# Patient Record
Sex: Female | Born: 1965 | Race: Black or African American | Hispanic: No | Smoking: Never smoker
Health system: Southern US, Community
[De-identification: ages and names within clinical notes are randomized; demographics above are authoritative.]

## PROBLEM LIST (undated history)

## (undated) DIAGNOSIS — I10 Essential (primary) hypertension: Secondary | ICD-10-CM

## (undated) DIAGNOSIS — F419 Anxiety disorder, unspecified: Secondary | ICD-10-CM

## (undated) HISTORY — PX: BREAST SURGERY: SHX581

## (undated) HISTORY — DX: Anxiety disorder, unspecified: F41.9

## (undated) HISTORY — DX: Essential (primary) hypertension: I10

## (undated) HISTORY — PX: CHOLECYSTECTOMY: SHX55

---

## 2004-05-17 ENCOUNTER — Other Ambulatory Visit: Payer: Self-pay

## 2004-11-30 ENCOUNTER — Ambulatory Visit: Payer: Self-pay | Admitting: Internal Medicine

## 2006-12-05 ENCOUNTER — Ambulatory Visit: Payer: Self-pay | Admitting: Internal Medicine

## 2009-10-16 ENCOUNTER — Ambulatory Visit: Payer: Self-pay | Admitting: Internal Medicine

## 2014-09-26 HISTORY — PX: BREAST EXCISIONAL BIOPSY: SUR124

## 2016-08-29 ENCOUNTER — Other Ambulatory Visit: Payer: Self-pay | Admitting: Internal Medicine

## 2016-08-31 ENCOUNTER — Other Ambulatory Visit: Payer: Self-pay | Admitting: *Deleted

## 2016-08-31 ENCOUNTER — Inpatient Hospital Stay
Admission: RE | Admit: 2016-08-31 | Discharge: 2016-08-31 | Disposition: A | Discharge status: Home / Self Care | Payer: Self-pay | Source: Ambulatory Visit | Attending: *Deleted | Admitting: *Deleted

## 2016-08-31 DIAGNOSIS — Z9289 Personal history of other medical treatment: Secondary | ICD-10-CM

## 2016-09-01 ENCOUNTER — Other Ambulatory Visit: Payer: Self-pay | Admitting: Internal Medicine

## 2016-09-01 DIAGNOSIS — Z1231 Encounter for screening mammogram for malignant neoplasm of breast: Secondary | ICD-10-CM

## 2016-10-07 ENCOUNTER — Ambulatory Visit: Payer: Self-pay | Attending: Internal Medicine

## 2016-11-18 DIAGNOSIS — Z79899 Other long term (current) drug therapy: Secondary | ICD-10-CM | POA: Diagnosis not present

## 2016-11-18 DIAGNOSIS — Z1329 Encounter for screening for other suspected endocrine disorder: Secondary | ICD-10-CM | POA: Diagnosis not present

## 2016-11-18 DIAGNOSIS — I10 Essential (primary) hypertension: Secondary | ICD-10-CM | POA: Diagnosis not present

## 2016-11-18 DIAGNOSIS — N390 Urinary tract infection, site not specified: Secondary | ICD-10-CM | POA: Diagnosis not present

## 2016-11-18 DIAGNOSIS — R7309 Other abnormal glucose: Secondary | ICD-10-CM | POA: Diagnosis not present

## 2016-11-18 DIAGNOSIS — Z0000 Encounter for general adult medical examination without abnormal findings: Secondary | ICD-10-CM | POA: Diagnosis not present

## 2016-11-18 DIAGNOSIS — Z13220 Encounter for screening for lipoid disorders: Secondary | ICD-10-CM | POA: Diagnosis not present

## 2016-12-13 ENCOUNTER — Telehealth: Payer: Self-pay | Admitting: Obstetrics & Gynecology

## 2016-12-13 NOTE — Telephone Encounter (Signed)
I called and spoke with Breanna Hull earlier this morning about moving up her referral from her appt in  April to Monday 26th with Dr. Kenton Kingfisher. Breanna Hull stated she would call back to see if she could make the more sooner appt. I called back to follow up about Monday's schedule appt her voicemail box was full unable to leave an message

## 2016-12-13 NOTE — Telephone Encounter (Signed)
Called pt to follow up about her schedule appt. VM box is full unable to leave message

## 2016-12-14 NOTE — Telephone Encounter (Signed)
Attempting to confirm appt schedule for 11/29/16 unable to reach pt due to voicemail box being full

## 2016-12-19 ENCOUNTER — Ambulatory Visit: Payer: Self-pay | Admitting: Obstetrics & Gynecology

## 2017-01-20 ENCOUNTER — Ambulatory Visit (INDEPENDENT_AMBULATORY_CARE_PROVIDER_SITE_OTHER): Payer: BLUE CROSS/BLUE SHIELD | Admitting: Obstetrics and Gynecology

## 2017-01-20 ENCOUNTER — Encounter: Payer: Self-pay | Admitting: Obstetrics and Gynecology

## 2017-01-20 VITALS — BP 152/96 | HR 59 | Ht 62.0 in | Wt 200.0 lb

## 2017-01-20 DIAGNOSIS — N852 Hypertrophy of uterus: Secondary | ICD-10-CM

## 2017-01-20 DIAGNOSIS — Z124 Encounter for screening for malignant neoplasm of cervix: Secondary | ICD-10-CM | POA: Diagnosis not present

## 2017-01-20 DIAGNOSIS — R875 Abnormal microbiological findings in specimens from female genital organs: Secondary | ICD-10-CM | POA: Diagnosis not present

## 2017-01-20 DIAGNOSIS — Z1239 Encounter for other screening for malignant neoplasm of breast: Secondary | ICD-10-CM

## 2017-01-20 DIAGNOSIS — Z1231 Encounter for screening mammogram for malignant neoplasm of breast: Secondary | ICD-10-CM | POA: Diagnosis not present

## 2017-01-20 DIAGNOSIS — Z01419 Encounter for gynecological examination (general) (routine) without abnormal findings: Secondary | ICD-10-CM

## 2017-01-20 NOTE — Patient Instructions (Signed)

## 2017-01-20 NOTE — Progress Notes (Signed)
Patient ID: Breanna Hull, female   DOB: 09-28-1965, 51 y.o.   MRN: [id number]     Gynecology Annual Exam  PCP: No PCP Per Patient  Chief Complaint:  Chief Complaint  Patient presents with  . Gynecologic Exam  . Leg Pain    History of Present Illness:Patient is a 51 y.o. [id number] presents for annual exam. The patient has no complaints today.   LMP: No LMP recorded. Patient is postmenopausal.  Denies postmenopausal bleeding.   The patient is sexually active. She denies dyspareunia.  The patient does not perform self breast exams.  There is no notable family history of breast or ovarian cancer in her family.  The patient wears seatbelts: yes.   The patient has regular exercise: not asked.    The patient denies current symptoms of depression.     Review of Systems: Review of Systems  Constitutional: Negative for chills and fever.  HENT: Negative for congestion.   Respiratory: Negative for cough and shortness of breath.   Cardiovascular: Negative for chest pain and palpitations.  Gastrointestinal: Negative for abdominal pain, constipation, diarrhea, heartburn, nausea and vomiting.  Genitourinary: Negative for dysuria, frequency and urgency.  Skin: Negative for itching and rash.  Neurological: Negative for dizziness and headaches.  Endo/Heme/Allergies: Negative for polydipsia.  Psychiatric/Behavioral: Negative for depression.    Past Medical History:  Past Medical History:  Diagnosis Date  . Anxiety   . Hypertension     Past Surgical History:  Past Surgical History:  Procedure Laterality Date  . BREAST BIOPSY     Right  . CHOLECYSTECTOMY      Gynecologic History:  No LMP recorded. Patient is postmenopausal. Last Pap: Results were: no abnormalities  Last mammogram: 1 year ago reportedly normal  Family History:  Family History  Problem Relation Age of Onset  . Breast cancer Mother   . Ovarian cancer Sister 35  . Breast cancer Maternal Aunt 49    Social History:   Social History   Social History  . Marital status: Divorced    Spouse name: N/A  . Number of children: N/A  . Years of education: N/A   Occupational History  . Not on file.   Social History Main Topics  . Smoking status: Never Smoker  . Smokeless tobacco: Never Used  . Alcohol use No  . Drug use: No  . Sexual activity: Not Currently   Other Topics Concern  . Not on file   Social History Narrative  . No narrative on file    Allergies:  No Known Allergies  Medications: Prior to Admission medications   Medication Sig Start Date End Date Taking? Authorizing Provider  sertraline (ZOLOFT) 50 MG tablet take 1 tablet by mouth once daily 01/18/17  Yes Historical Provider, MD  losartan-hydrochlorothiazide [facility]) 100-12.5 MG tablet  01/18/17   Historical Provider, MD    Physical Exam Vitals: Blood pressure (!) 152/96, pulse (!) 59, height 5\' 2"  (1.575 m), weight 200 lb (90.7 kg).  General: NAD HEENT: normocephalic, anicteric Thyroid: no enlargement, no palpable nodules Pulmonary: No increased work of breathing, CTAB Cardiovascular: RRR, distal pulses 2+ Breast: Breast symmetrical, no tenderness, no palpable nodules or masses, no skin or nipple retraction present, no nipple discharge.  No axillary or supraclavicular lymphadenopathy. Abdomen: NABS, soft, non-tender, non-distended.  Umbilicus without lesions.  No hepatomegaly, splenomegaly or masses palpable. No evidence of hernia  Genitourinary:  External: Normal external female genitalia.  Normal urethral meatus, normal  Bartholin's and Skene's glands.  Vagina: Normal vaginal mucosa, no evidence of prolapse.    Cervix: Grossly normal in appearance, no bleeding  Uterus: Enlarged 12-15 weeks size, mobile, irregular2contour.  No CMT  Adnexa: ovaries non-enlarged, no adnexal masses  Rectal: deferred  Lymphatic: no evidence of inguinal lymphadenopathy Extremities: no edema, erythema, or tenderness Neurologic: Grossly  intact Psychiatric: mood appropriate, affect full  Female chaperone present for pelvic and breast  portions of the physical exam    Assessment: 51 y.o. [id number] No problem-specific Assessment & Plan notes found for this encounter.   Plan: Problem List Items Addressed This Visit    None    Visit Diagnoses    Bulky or enlarged uterus    -  Primary   Relevant Orders   US Transvaginal Non-OB   Screening for malignant neoplasm of cervix       Relevant Orders   PapIG, HPV, rfx 16/18   Breast screening       Relevant Orders   MM DIGITAL SCREENING BILATERAL   Encounter for gynecological examination without abnormal finding       Relevant Orders   PapIG, HPV, rfx 16/18      1) Mammogram - recommend yearly screening mammogram.  Mammogram Was ordered today  2) STI screening was offered and declined  3) ASCCP guidelines and rational discussed.  Patient opts for every 3 years screening interval  4) Osteoporosis  - per USPTF routine screening DEXA at age 30  5) Routine healthcare maintenance including cholesterol, diabetes screening discussed managed by PCP  6) Colonoscopy - recommended declined given handout on cologuard testing  7) TVUS in the next 1-2 weeks to assess uterine enlargement noted on physical exam suspect fibroids  8)  Follow up 1 year for routine annual

## 2017-01-25 LAB — PAPIG, HPV, RFX 16/18
HPV, HIGH-RISK: NEGATIVE
PAP Smear Comment: 0

## 2017-01-26 NOTE — Progress Notes (Signed)
Follow up on trichomonas NAA added on and tell patient results

## 2017-01-31 ENCOUNTER — Other Ambulatory Visit: Payer: Self-pay | Admitting: *Deleted

## 2017-01-31 ENCOUNTER — Inpatient Hospital Stay
Admission: RE | Admit: 2017-01-31 | Discharge: 2017-01-31 | Disposition: A | Discharge status: Home / Self Care | Payer: Self-pay | Source: Ambulatory Visit | Attending: *Deleted | Admitting: *Deleted

## 2017-01-31 DIAGNOSIS — Z1231 Encounter for screening mammogram for malignant neoplasm of breast: Secondary | ICD-10-CM

## 2017-02-02 ENCOUNTER — Ambulatory Visit
Admission: RE | Admit: 2017-02-02 | Discharge: 2017-02-02 | Disposition: A | Discharge status: Home / Self Care | Payer: BLUE CROSS/BLUE SHIELD | Source: Ambulatory Visit | Attending: Obstetrics and Gynecology | Admitting: Obstetrics and Gynecology

## 2017-02-02 DIAGNOSIS — Z1231 Encounter for screening mammogram for malignant neoplasm of breast: Secondary | ICD-10-CM | POA: Insufficient documentation

## 2017-02-02 DIAGNOSIS — Z1239 Encounter for other screening for malignant neoplasm of breast: Secondary | ICD-10-CM

## 2017-02-07 ENCOUNTER — Other Ambulatory Visit: Payer: Self-pay | Admitting: *Deleted

## 2017-02-07 ENCOUNTER — Inpatient Hospital Stay
Admission: RE | Admit: 2017-02-07 | Discharge: 2017-02-07 | Disposition: A | Discharge status: Home / Self Care | Payer: Self-pay | Source: Ambulatory Visit | Attending: *Deleted | Admitting: *Deleted

## 2017-02-07 DIAGNOSIS — Z9289 Personal history of other medical treatment: Secondary | ICD-10-CM

## 2017-02-08 ENCOUNTER — Ambulatory Visit (INDEPENDENT_AMBULATORY_CARE_PROVIDER_SITE_OTHER): Payer: BLUE CROSS/BLUE SHIELD

## 2017-02-08 ENCOUNTER — Encounter: Payer: Self-pay | Admitting: Obstetrics and Gynecology

## 2017-02-08 ENCOUNTER — Ambulatory Visit (INDEPENDENT_AMBULATORY_CARE_PROVIDER_SITE_OTHER): Payer: BLUE CROSS/BLUE SHIELD | Admitting: Obstetrics and Gynecology

## 2017-02-08 VITALS — BP 128/78 | Wt 198.0 lb

## 2017-02-08 DIAGNOSIS — D259 Leiomyoma of uterus, unspecified: Secondary | ICD-10-CM | POA: Diagnosis not present

## 2017-02-08 DIAGNOSIS — A5901 Trichomonal vulvovaginitis: Secondary | ICD-10-CM | POA: Diagnosis not present

## 2017-02-08 DIAGNOSIS — Z113 Encounter for screening for infections with a predominantly sexual mode of transmission: Secondary | ICD-10-CM

## 2017-02-08 DIAGNOSIS — N852 Hypertrophy of uterus: Secondary | ICD-10-CM

## 2017-02-08 NOTE — Patient Instructions (Signed)

## 2017-02-08 NOTE — Progress Notes (Signed)
Gynecology Ultrasound Follow Up  Chief Complaint:  Chief Complaint  Patient presents with  . U/S follow up  . Gynecologic Exam     History of Present Illness: Patient is a 51 y.o. female who presents today for ultrasound evaluation of enlarged uterus noted on annual exam .  Ultrasound demonstrates the following findgins Adnexa: no masses seen, ovaries not clearly visualzied Uterus: Single 2x2cm intramural fibroid with endometrial stripe slightly irregular but less than 60mm Additional: no free fluid  She also presents for trichomonas testing given positive on pap.  However, the patient is deemed low risk, has not been sexually active for several years, is asymptomatic with no vaginal discharge.    Review of Systems: Review of Systems  Gastrointestinal: Negative for abdominal pain.  Remainder of HPI negative unless otherwise noted in HPI  Past Medical History:  Past Medical History:  Diagnosis Date  . Anxiety   . Hypertension     Past Surgical History:  Past Surgical History:  Procedure Laterality Date  . BREAST EXCISIONAL BIOPSY Right 2016   NEG  . CHOLECYSTECTOMY      Gynecologic History:  No LMP recorded. Patient is postmenopausal.  Family History:  Family History  Problem Relation Age of Onset  . Ovarian cancer Sister 55  . Breast cancer Maternal Aunt 49    Social History:  Social History   Social History  . Marital status: Divorced    Spouse name: N/A  . Number of children: N/A  . Years of education: N/A   Occupational History  . Not on file.   Social History Main Topics  . Smoking status: Never Smoker  . Smokeless tobacco: Never Used  . Alcohol use No  . Drug use: No  . Sexual activity: Not Currently   Other Topics Concern  . Not on file   Social History Narrative  . No narrative on file    Allergies:  No Known Allergies  Medications: Prior to Admission medications   Medication Sig Start Date End Date Taking? Authorizing Provider   losartan-hydrochlorothiazide [facility]) 100-12.5 MG tablet  01/18/17   [provider]  sertraline (ZOLOFT) 50 MG tablet take 1 tablet by mouth once daily 01/18/17   [provider]    Physical Exam Vitals: Blood pressure 128/78, weight 198 lb (89.8 kg).  General: NAD HEENT: normocephalic, anicteric Pulmonary: No increased work of breathing Extremities: no edema, erythema, or tenderness Genitourianry: normal external female genital, normal appearing cervix, physiologic discharge Neurologic: Grossly intact, normal gait Psychiatric: mood appropriate, affect full  Wet mount Negative for clue cells, hyphea, or trichomonas   Assessment: 51 y.o. [id number] follow up trichomonas on pap, uterine enlargement noted at time of annual with 2x2cm intramural fibroid on ultrasound today  Plan: Problem List Items Addressed This Visit    None    Visit Diagnoses    Vaginitis due to Trichomonas    -  Primary   Relevant Orders   NuSwab Vaginitis Plus (VG+)   Uterine leiomyoma, unspecified location       Routine screening for STI (sexually transmitted infection)       Relevant Orders   NuSwab Vaginitis Plus (VG+)      1) Ultrasound - single 2cm intramural fibroid.  No bleeding concerns, asymptomatic, discussed natural course of fibroids, generally decrease in size postmenopausal years.   2) Trichomonas on pap - has not been sexually active in several years, retested today, no motile trichomonas on wet mount. 3) A total of  15 minutes were spent in face-to-face contact with the patient during this encounter with over half of that time devoted to counseling and coordination of care.

## 2017-02-11 LAB — NUSWAB VAGINITIS PLUS (VG+)
CANDIDA ALBICANS, NAA: NEGATIVE
CANDIDA GLABRATA, NAA: NEGATIVE
CHLAMYDIA TRACHOMATIS, NAA: NEGATIVE
NEISSERIA GONORRHOEAE, NAA: NEGATIVE
Trich vag by NAA: POSITIVE — AB

## 2017-02-14 ENCOUNTER — Other Ambulatory Visit: Payer: Self-pay | Admitting: Obstetrics and Gynecology

## 2017-02-14 MED ORDER — METRONIDAZOLE 500 MG PO TABS: 2000.0000 mg | ORAL_TABLET | Freq: Once | ORAL | 0 refills | Status: AC

## 2019-05-29 ENCOUNTER — Ambulatory Visit: Payer: Self-pay

## 2019-05-29 ENCOUNTER — Other Ambulatory Visit: Payer: Self-pay

## 2019-06-04 ENCOUNTER — Ambulatory Visit: Payer: Self-pay | Admitting: Licensed Clinical Social Worker

## 2019-06-04 ENCOUNTER — Encounter: Payer: Self-pay | Admitting: Gerontology

## 2019-06-04 ENCOUNTER — Other Ambulatory Visit: Payer: Self-pay

## 2019-06-04 ENCOUNTER — Ambulatory Visit: Payer: Self-pay | Admitting: Gerontology

## 2019-06-04 VITALS — BP 185/95 | HR 60 | Temp 98.4°F | Wt 179.0 lb

## 2019-06-04 DIAGNOSIS — F332 Major depressive disorder, recurrent severe without psychotic features: Secondary | ICD-10-CM

## 2019-06-04 DIAGNOSIS — I10 Essential (primary) hypertension: Secondary | ICD-10-CM

## 2019-06-04 DIAGNOSIS — F419 Anxiety disorder, unspecified: Secondary | ICD-10-CM

## 2019-06-04 DIAGNOSIS — Z7689 Persons encountering health services in other specified circumstances: Secondary | ICD-10-CM

## 2019-06-04 DIAGNOSIS — F410 Panic disorder [episodic paroxysmal anxiety] without agoraphobia: Secondary | ICD-10-CM

## 2019-06-04 MED ORDER — AMLODIPINE BESYLATE 5 MG PO TABS: 5.0000 mg | ORAL_TABLET | Freq: Every day | ORAL | 0 refills | Status: DC

## 2019-06-04 MED ORDER — BLOOD PRESSURE KIT: 1.0000 | PACK | Freq: Every day | 0 refills | Status: AC

## 2019-06-04 NOTE — Progress Notes (Signed)
Patient ID: Breanna Hull, female   DOB: 04-07-1966, 53 y.o.   MRN: [id number]  Chief Complaint  Patient presents with  . Establish Care    HPI Breanna Hull is a 53 y.o. female who presents to establish care and evaluation of her chronic problems. She states that she has hypertension and stopped taking10 mg Amlodipine in 2018. She denies headache, chest pain and palpitation . She also reports that she has a history of anxiety and was taking 50 mg of Zoloft until she ran out of refills. She experiences intermittent dizziness with panic attacks, reports that her mood fluctuates, she is emotional and was teary during visit. She denies suicidal or homicidal ideation. She states that she's being divorced for 6 years and in a new relationship, but she continues to pay her ex husbands mortgage which causes her a great deal of stress. She states that her last mammogram was done in 2016 after having an excisional biopsy to her right breast in 2016 which was negative. She also hasn't had colonoscopy done. She denies fever, chills, and no further concerns.   Past Medical History:  Diagnosis Date  . Anxiety   . Hypertension     Past Surgical History:  Procedure Laterality Date  . BREAST EXCISIONAL BIOPSY Right 2016   NEG  . CHOLECYSTECTOMY      Family History  Problem Relation Age of Onset  . Ovarian cancer Sister 63  . Breast cancer Maternal Aunt 41    Social History Social History   Tobacco Use  . Smoking status: Never Smoker  . Smokeless tobacco: Never Used  Substance Use Topics  . Alcohol use: No  . Drug use: No    No Known Allergies  Current Outpatient Medications  Medication Sig Dispense Refill  . amLODipine (NORVASC) 5 MG tablet Take 1 tablet (5 mg total) by mouth daily. 30 tablet 0  . Blood Pressure KIT 1 kit by Does not apply route daily. 1 kit 0  . sertraline (ZOLOFT) 50 MG tablet take 1 tablet by mouth once daily     No current facility-administered medications for this  visit.     Review of Systems Review of Systems  Constitutional: Negative.   HENT: Negative.   Respiratory: Negative.   Cardiovascular: Negative.   Gastrointestinal: Negative.   Endocrine: Negative.   Genitourinary: Negative.   Musculoskeletal: Negative.   Skin: Negative.   Neurological: Positive for dizziness (intemittent with panic attack).  Psychiatric/Behavioral: The patient is nervous/anxious (teary).     Blood pressure (!) 185/95, pulse 60, temperature 98.4 F (36.9 C), weight 179 lb (81.2 kg), SpO2 98 %.  Physical Exam Physical Exam Constitutional:      Appearance: Normal appearance.  HENT:     Head: Normocephalic and atraumatic.     Nose: Nose normal.     Mouth/Throat:     Mouth: Mucous membranes are moist.  Eyes:     Extraocular Movements: Extraocular movements intact.     Pupils: Pupils are equal, round, and reactive to light.  Cardiovascular:     Rate and Rhythm: Normal rate and regular rhythm.     Pulses: Normal pulses.     Heart sounds: Normal heart sounds.  Pulmonary:     Effort: Pulmonary effort is normal.     Breath sounds: Normal breath sounds.  Abdominal:     General: Abdomen is flat.     Palpations: Abdomen is soft.  Skin:    General: Skin is warm and dry.  Neurological:  General: No focal deficit present.     Mental Status: She is alert and oriented to person, place, and time.  Psychiatric:        Mood and Affect: Mood normal.        Behavior: Behavior normal.        Thought Content: Thought content normal.        Judgment: Judgment normal.     Comments: Teary during visit     Data Reviewed Past medical history was reviewed and routine labs will be checked.  Assessment and Plan  1. Encounter to establish care - Routine labs will be checked. - CBC w/Diff; Future - Lipid panel; Future - Comp Met (CMET); Future - Urinalysis; Future - HgB A1c; Future - TSH; Future Ambulatory referral to Hematology / Oncology for Mammogram through  [facility]. -- She was encouraged to complete charity care application for referral to  Ambulatory referral to Gastroenterology  2. Essential hypertension - Uncontrolled hypertension due to loss to follow up, and her goal blood pressure is < 140/90. She was advised to start Amlodipine, and was educated on medication side effects and to notify clinic. She was advised to continue on low sodium diet and exercise as tolerated. She was advised to check her blood pressure twice daily, document and bring log to clinic. She was advised to notify clinic or go to the ED with chest pain and worsening symptoms. - amLODipine (NORVASC) 5 MG tablet; Take 1 tablet (5 mg total) by mouth daily.  Dispense: 30 tablet; Refill: 0 - Blood Pressure KIT; 1 kit by Does not apply route daily.  Dispense: 1 kit; Refill: 0  3. Anxiety - She will follow up with Ms. Simpson for mental health care and was advised to go to the ED with worsening anxiety or suicidal or homicidal ideation.   Follow up on 06/18/2019 ( 2 weeks) if symptom worsens or fails to imptove.  Afnan Cadiente E Madix Blowe 06/04/2019, 11:26 AM

## 2019-06-04 NOTE — Patient Instructions (Signed)
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
## 2019-06-05 ENCOUNTER — Other Ambulatory Visit: Payer: Self-pay

## 2019-06-05 DIAGNOSIS — Z7689 Persons encountering health services in other specified circumstances: Secondary | ICD-10-CM

## 2019-06-06 ENCOUNTER — Telehealth: Payer: Self-pay

## 2019-06-06 ENCOUNTER — Other Ambulatory Visit: Payer: Self-pay

## 2019-06-06 DIAGNOSIS — Z1211 Encounter for screening for malignant neoplasm of colon: Secondary | ICD-10-CM

## 2019-06-06 LAB — URINALYSIS
Bilirubin, UA: NEGATIVE
Glucose, UA: NEGATIVE
Ketones, UA: NEGATIVE
Leukocytes,UA: NEGATIVE
Nitrite, UA: NEGATIVE
Protein,UA: NEGATIVE
RBC, UA: NEGATIVE
Specific Gravity, UA: 1.02 (ref 1.005–1.030)
Urobilinogen, Ur: 0.2 mg/dL (ref 0.2–1.0)
pH, UA: 5.5 (ref 5.0–7.5)

## 2019-06-06 LAB — CBC WITH DIFFERENTIAL/PLATELET
Basophils Absolute: 0 10*3/uL (ref 0.0–0.2)
Basos: 0 %
EOS (ABSOLUTE): 0.1 10*3/uL (ref 0.0–0.4)
Eos: 2 %
Hematocrit: 35.7 % (ref 34.0–46.6)
Hemoglobin: 12.2 g/dL (ref 11.1–15.9)
Immature Grans (Abs): 0 10*3/uL (ref 0.0–0.1)
Immature Granulocytes: 0 %
Lymphocytes Absolute: 1.4 10*3/uL (ref 0.7–3.1)
Lymphs: 23 %
MCH: 29.9 pg (ref 26.6–33.0)
MCHC: 34.2 g/dL (ref 31.5–35.7)
MCV: 88 fL (ref 79–97)
Monocytes Absolute: 0.3 10*3/uL (ref 0.1–0.9)
Monocytes: 6 %
Neutrophils Absolute: 4.1 10*3/uL (ref 1.4–7.0)
Neutrophils: 69 %
Platelets: 267 10*3/uL (ref 150–450)
RBC: 4.08 x10E6/uL (ref 3.77–5.28)
RDW: 12.5 % (ref 11.7–15.4)
WBC: 6 10*3/uL (ref 3.4–10.8)

## 2019-06-06 LAB — COMPREHENSIVE METABOLIC PANEL
ALT: 7 IU/L (ref 0–32)
AST: 16 IU/L (ref 0–40)
Albumin/Globulin Ratio: 1.1 — ABNORMAL LOW (ref 1.2–2.2)
Albumin: 3.6 g/dL — ABNORMAL LOW (ref 3.8–4.9)
Alkaline Phosphatase: 79 IU/L (ref 39–117)
BUN/Creatinine Ratio: 22 (ref 9–23)
BUN: 24 mg/dL (ref 6–24)
Bilirubin Total: 0.5 mg/dL (ref 0.0–1.2)
CO2: 27 mmol/L (ref 20–29)
Calcium: 9.4 mg/dL (ref 8.7–10.2)
Chloride: 104 mmol/L (ref 96–106)
Creatinine, Ser: 1.09 mg/dL — ABNORMAL HIGH (ref 0.57–1.00)
GFR calc Af Amer: 67 mL/min/{1.73_m2} (ref >59)
GFR calc non Af Amer: 58 mL/min/{1.73_m2} — ABNORMAL LOW (ref >59)
Globulin, Total: 3.2 g/dL (ref 1.5–4.5)
Glucose: 85 mg/dL (ref 65–99)
Potassium: 4 mmol/L (ref 3.5–5.2)
Sodium: 145 mmol/L — ABNORMAL HIGH (ref 134–144)
Total Protein: 6.8 g/dL (ref 6.0–8.5)

## 2019-06-06 LAB — LIPID PANEL
Chol/HDL Ratio: 2.4 ratio (ref 0.0–4.4)
Cholesterol, Total: 232 mg/dL — ABNORMAL HIGH (ref 100–199)
HDL: 97 mg/dL (ref >39)
LDL Chol Calc (NIH): 124 mg/dL — ABNORMAL HIGH (ref 0–99)
Triglycerides: 64 mg/dL (ref 0–149)
VLDL Cholesterol Cal: 11 mg/dL (ref 5–40)

## 2019-06-06 LAB — HEMOGLOBIN A1C
Est. average glucose Bld gHb Est-mCnc: 103 mg/dL
Hgb A1c MFr Bld: 5.2 % (ref 4.8–5.6)

## 2019-06-06 LAB — TSH: TSH: 1.87 u[IU]/mL (ref 0.450–4.500)

## 2019-06-06 MED ORDER — GOLYTELY 236 G PO SOLR: 4000.0000 mL | Freq: Once | ORAL | 0 refills | Status: AC

## 2019-06-06 NOTE — Telephone Encounter (Signed)
Gastroenterology Pre-Procedure Review  Request Date: 06/11/19 Requesting Physician: Dr. Vicente Males  PATIENT REVIEW QUESTIONS: The patient responded to the following health history questions as indicated:    1. Are you having any GI issues? no 2. Do you have a personal history of Polyps? no 3. Do you have a family history of Colon Cancer or Polyps? no 4. Diabetes Mellitus? no 5. Joint replacements in the past 12 months?no 6. Major health problems in the past 3 months?no 7. Any artificial heart valves, MVP, or defibrillator?no    MEDICATIONS & ALLERGIES:    Patient reports the following regarding taking any anticoagulation/antiplatelet therapy:   Plavix, Coumadin, Eliquis, Xarelto, Lovenox, Pradaxa, Brilinta, or Effient? no Aspirin? no  Patient confirms/reports the following medications:  Current Outpatient Medications  Medication Sig Dispense Refill  . amLODipine (NORVASC) 5 MG tablet Take 1 tablet (5 mg total) by mouth daily. 30 tablet 0  . Blood Pressure KIT 1 kit by Does not apply route daily. 1 kit 0  . polyethylene glycol (GOLYTELY) 236 g solution Take 4,000 mLs by mouth once for 1 dose. 4000 mL 0  . sertraline (ZOLOFT) 50 MG tablet take 1 tablet by mouth once daily     No current facility-administered medications for this visit.     Patient confirms/reports the following allergies:  No Known Allergies  No orders of the defined types were placed in this encounter.   AUTHORIZATION INFORMATION Primary Insurance: 1D#: Group #:  Secondary Insurance: 1D#: Group #:  SCHEDULE INFORMATION: Date: 06/11/19 Time: Location:[facility]

## 2019-06-07 ENCOUNTER — Other Ambulatory Visit: Payer: Self-pay

## 2019-06-07 ENCOUNTER — Other Ambulatory Visit
Admission: RE | Admit: 2019-06-07 | Discharge: 2019-06-07 | Disposition: A | Discharge status: Home / Self Care | Payer: HRSA Program | Source: Ambulatory Visit | Attending: Gastroenterology | Admitting: Gastroenterology

## 2019-06-07 DIAGNOSIS — Z01812 Encounter for preprocedural laboratory examination: Secondary | ICD-10-CM | POA: Insufficient documentation

## 2019-06-07 DIAGNOSIS — Z20828 Contact with and (suspected) exposure to other viral communicable diseases: Secondary | ICD-10-CM | POA: Diagnosis not present

## 2019-06-08 LAB — SARS CORONAVIRUS 2 (TAT 6-24 HRS): SARS Coronavirus 2: NEGATIVE

## 2019-06-11 ENCOUNTER — Other Ambulatory Visit: Payer: Self-pay

## 2019-06-11 ENCOUNTER — Encounter: Payer: Self-pay | Admitting: *Deleted

## 2019-06-11 ENCOUNTER — Encounter
Admission: RE | Disposition: A | Discharge status: Home / Self Care | Payer: Self-pay | Source: Home / Self Care | Attending: Gastroenterology

## 2019-06-11 ENCOUNTER — Ambulatory Visit: Payer: Self-pay | Admitting: Anesthesiology

## 2019-06-11 ENCOUNTER — Ambulatory Visit
Admission: RE | Admit: 2019-06-11 | Discharge: 2019-06-11 | Disposition: A | Discharge status: Home / Self Care | Payer: Self-pay | Attending: Gastroenterology | Admitting: Gastroenterology

## 2019-06-11 ENCOUNTER — Ambulatory Visit: Payer: Self-pay | Admitting: Licensed Clinical Social Worker

## 2019-06-11 DIAGNOSIS — Z1211 Encounter for screening for malignant neoplasm of colon: Secondary | ICD-10-CM | POA: Insufficient documentation

## 2019-06-11 DIAGNOSIS — Z79899 Other long term (current) drug therapy: Secondary | ICD-10-CM | POA: Insufficient documentation

## 2019-06-11 DIAGNOSIS — I10 Essential (primary) hypertension: Secondary | ICD-10-CM | POA: Insufficient documentation

## 2019-06-11 DIAGNOSIS — F410 Panic disorder [episodic paroxysmal anxiety] without agoraphobia: Secondary | ICD-10-CM

## 2019-06-11 DIAGNOSIS — F419 Anxiety disorder, unspecified: Secondary | ICD-10-CM | POA: Insufficient documentation

## 2019-06-11 DIAGNOSIS — F332 Major depressive disorder, recurrent severe without psychotic features: Secondary | ICD-10-CM

## 2019-06-11 HISTORY — PX: COLONOSCOPY WITH PROPOFOL: SHX5780

## 2019-06-11 SURGERY — COLONOSCOPY WITH PROPOFOL
Anesthesia: General

## 2019-06-11 MED ORDER — SODIUM CHLORIDE 0.9 % IV SOLN
INTRAVENOUS | Status: DC
  Administered 2019-06-11: 1000 mL via INTRAVENOUS

## 2019-06-11 MED ORDER — PROPOFOL 500 MG/50ML IV EMUL
INTRAVENOUS | Status: DC | PRN
  Administered 2019-06-11: 150 ug/kg/min via INTRAVENOUS

## 2019-06-11 MED ORDER — LIDOCAINE HCL (PF) 2 % IJ SOLN
INTRAMUSCULAR | Status: AC
  Filled 2019-06-11: qty 10

## 2019-06-11 MED ORDER — SERTRALINE HCL 50 MG PO TABS: 50.0000 mg | ORAL_TABLET | Freq: Every day | ORAL | 0 refills | Status: DC

## 2019-06-11 MED ORDER — LIDOCAINE HCL (CARDIAC) PF 100 MG/5ML IV SOSY
PREFILLED_SYRINGE | INTRAVENOUS | Status: DC | PRN
  Administered 2019-06-11 (×2): 40 mg via INTRAVENOUS

## 2019-06-11 MED ORDER — PROPOFOL 500 MG/50ML IV EMUL
INTRAVENOUS | Status: AC
  Filled 2019-06-11: qty 50

## 2019-06-11 MED ORDER — PROPOFOL 10 MG/ML IV BOLUS
INTRAVENOUS | Status: DC | PRN
  Administered 2019-06-11: 100 mg via INTRAVENOUS

## 2019-06-11 NOTE — Transfer of Care (Signed)
Immediate Anesthesia Transfer of Care Note  Patient: Breanna Hull  Procedure(s) Performed: Procedure(s): COLONOSCOPY WITH PROPOFOL (N/A)  Patient Location: PACU and Endoscopy Unit  Anesthesia Type:General  Level of Consciousness: sedated  Airway & Oxygen Therapy: Patient Spontanous Breathing and Patient connected to nasal cannula oxygen  Post-op Assessment: Report given to RN and Post -op Vital signs reviewed and stable  Post vital signs: Reviewed and stable  Last Vitals:  Vitals:   06/11/19 0745 06/11/19 0914  BP: (!) 181/89 (!) 149/96  Pulse: (!) 52   Resp: 16 17  Temp: (!) 36.1 C   SpO2: [id number]    Complications: No apparent anesthesia complications

## 2019-06-11 NOTE — BH Specialist Note (Signed)
Integrated Behavioral Health Follow Up Visit Via Phone  MRN: [id number] Name: Breanna Hull  Number of [place] Clinician visits: 1/6   Type of Service: [place] Interpretor:No. Interpretor Name and Language: not applicable.   SUBJECTIVE: Breanna Hull is a 53 y.o. female accompanied by herself. Patient was referred by Carlyon Shadow NP for mental health. Patient reports the following symptoms/concerns: She reports that she was overdue for colonoscopy and decided to go ahead and have the procedure. She notes that she has a mammogram scheduled for next week on Wednesday. She notes that prior to coming to [facility] that she has not seen a doctor since 2018. She reports that she had anxiety attack driving to pick up the stuff she needed to drink prior to the colonoscopy this morning. She notes that her daughter sent her some job applications but has yet to look at them and has income from unemployment until December of 2020. She notes that she is struggling with anger episodes because she will say one thing but mean another and regret saying it after. She denies suicidal and homicidal thoughts. Duration of problem: ; Severity of problem: moderate  OBJECTIVE: Mood: Euthymic and Affect: Appropriate Risk of harm to self or others: No plan to harm self or others  LIFE CONTEXT: Family and Social: Ms. Sarka reports that she sees her friend at least once a day or sometimes more. She reports that towards the end of her marriage that she would binge on sweats to get the emotional response that she was not getting in her marriage. She notes that the last time she loved her body was before she had kids.  School/Work: see above. Self-Care: Ms. Mauss reports that she walks a lot at the park near her house and walks for a little while in the mornings. She notes that she will do a ten minute exercise watching you tube or walk away the pounds when she is  unable to get outside. She notes that she will go on a walk when she gets the urge to binge eat like she did in the past. She notes that she tries to stay active and focus on her health. She notes that she used to garden when she had a house.  Life Changes: see above.   GOALS ADDRESSED: Patient will: 1.  Reduce symptoms of: agitation  2.  Increase knowledge and/or ability of: coping skills, healthy habits and stress reduction  3.  Demonstrate ability to: Increase healthy adjustment to current life circumstances  INTERVENTIONS: Interventions utilized:  Brief CBT was utilized by the clinician focusing on the patient's agitation and affect on behavior. Clinician processed with the patient regarding how she has been doing since the last follow up session. Clinician explained to the patient that she is glad to hear that her colonoscopy went well and that she is feeling better. Clinician provided psycho education to the patient regarding that her anger stems from her anxiety. Clinician suggested that the patient find a healthy outlet to channel her anger so she is not taking it on other people. Clinician asked the patient if she has had any hobbies in the past or anything she used to do for enjoyment. Clinician explained to the patient that she has to work on some self management techniques for when she becomes agitated, expressing that she is upset and that she needs a minute to calm down before she takes it out on someone else. Standardized Assessments completed: next session  ASSESSMENT:  Patient currently experiencing see above.   Patient may benefit from see above.  PLAN: 1. Follow up with behavioral health clinician on : one week or earlier if needed. 2. Behavioral recommendations: Case consultation with Dr. Octavia Heir, MD, psychiatric consultant recommended that Ms. Hollick start Zoloft 25 for three to four days and if tolerated with no side effects, increase to 50 mg PO daily for anxiety, depression,  and panic attacks. No further recommendations at this point in time. Patient was agreeable to the plans.  3. Referral(s): [place] (In Clinic) 4. "From scale of 1-10, how likely are you to follow plan?":   Bayard Hugger, LCSW

## 2019-06-11 NOTE — Anesthesia Procedure Notes (Signed)
Date/Time: 06/11/2019 8:48 AM Performed by: Doreen Salvage, CRNA Pre-anesthesia Checklist: Patient identified, Emergency Drugs available, Suction available and Patient being monitored Patient Re-evaluated:Patient Re-evaluated prior to induction Oxygen Delivery Method: Nasal cannula Induction Type: IV induction Dental Injury: Teeth and Oropharynx as per pre-operative assessment  Comments: Nasal cannula with etCO2 monitoring
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
## 2019-06-11 NOTE — Anesthesia Preprocedure Evaluation (Signed)
Anesthesia Evaluation  Patient identified by MRN, date of birth, ID band Patient awake    Reviewed: Allergy & Precautions, H&P , NPO status , Patient's Chart, lab work & pertinent test results, reviewed documented beta blocker date and time   Airway Mallampati: II   Neck ROM: full    Dental  (+) Poor Dentition   Pulmonary neg pulmonary ROS,    Pulmonary exam normal        Cardiovascular Exercise Tolerance: Good hypertension, On Medications negative cardio ROS Normal cardiovascular exam Rhythm:regular Rate:Normal     Neuro/Psych Anxiety negative neurological ROS  negative psych ROS   GI/Hepatic negative GI ROS, Neg liver ROS,   Endo/Other  negative endocrine ROS  Renal/GU negative Renal ROS  negative genitourinary   Musculoskeletal   Abdominal   Peds  Hematology negative hematology ROS (+)   Anesthesia Other Findings Past Medical History: No date: Anxiety No date: Hypertension Past Surgical History: 2016: BREAST EXCISIONAL BIOPSY; Right     Comment:  NEG No date: BREAST SURGERY No date: CHOLECYSTECTOMY BMI    Body Mass Index: 31.83 kg/m     Reproductive/Obstetrics negative OB ROS                             Anesthesia Physical Anesthesia Plan  ASA: II  Anesthesia Plan: General   Post-op Pain Management:    Induction:   PONV Risk Score and Plan:   Airway Management Planned:   Additional Equipment:   Intra-op Plan:   Post-operative Plan:   Informed Consent: I have reviewed the patients History and Physical, chart, labs and discussed the procedure including the risks, benefits and alternatives for the proposed anesthesia with the patient or authorized representative who has indicated his/her understanding and acceptance.     Dental Advisory Given  Plan Discussed with: CRNA  Anesthesia Plan Comments:         Anesthesia Quick Evaluation

## 2019-06-11 NOTE — Anesthesia Post-op Follow-up Note (Signed)
Anesthesia QCDR form completed.        

## 2019-06-11 NOTE — Op Note (Signed)
[facility] Gastroenterology Patient Name: Breanna Hull Procedure Date: 06/11/2019 8:45 AM MRN: [id number] Account #: [id number] Date of Birth: September 15, 1966 Admit Type: Outpatient Age: 53 Room: [facility] ENDO ROOM 1 Gender: Female Note Status: Finalized Procedure:            Colonoscopy Indications:          Screening for colorectal malignant neoplasm Providers:            Jonathon Bellows MD, MD Referring MD:         No Local Md, MD (Referring MD) Medicines:            Monitored Anesthesia Care Complications:        No immediate complications. Procedure:            Pre-Anesthesia Assessment:                       - Prior to the procedure, a History and Physical was                        performed, and patient medications, allergies and                        sensitivities were reviewed. The patient's tolerance of                        previous anesthesia was reviewed.                       - The risks and benefits of the procedure and the                        sedation options and risks were discussed with the                        patient. All questions were answered and informed                        consent was obtained.                       - ASA Grade Assessment: II - A patient with mild                        systemic disease.                       After obtaining informed consent, the colonoscope was                        passed under direct vision. Throughout the procedure,                        the patient's blood pressure, pulse, and oxygen                        saturations were monitored continuously. The                        Colonoscope was introduced through the anus and  advanced to the the cecum, identified by the                        appendiceal orifice. The colonoscopy was performed with                        ease. The patient tolerated the procedure well. The                        quality of the bowel preparation was  excellent. Findings:      The perianal and digital rectal examinations were normal.      The entire examined colon appeared normal on direct and retroflexion       views. Impression:           - The entire examined colon is normal on direct and                        retroflexion views.                       - No specimens collected. Recommendation:       - Discharge patient to home (with escort).                       - Advance diet as tolerated.                       - Continue present medications.                       - Repeat colonoscopy in 10 years for screening purposes. Procedure Code(s):    --- Professional ---                       [phone], Colonoscopy, flexible; diagnostic, including                        collection of specimen(s) by brushing or washing, when                        performed (separate procedure) Diagnosis Code(s):    --- Professional ---                       Z12.11, Encounter for screening for malignant neoplasm                        of colon CPT copyright 2019 American Medical Association. All rights reserved. The codes documented in this report are preliminary and upon coder review may  be revised to meet current compliance requirements. Jonathon Bellows, MD Jonathon Bellows MD, MD 06/11/2019 9:10:18 AM This report has been signed electronically. Number of Addenda: 0 Note Initiated On: 06/11/2019 8:45 AM Scope Withdrawal Time: 0 hours 7 minutes 13 seconds  Total Procedure Duration: 0 hours 9 minutes 55 seconds  Estimated Blood Loss: Estimated blood loss: none.      [facility]

## 2019-06-12 ENCOUNTER — Encounter: Payer: Self-pay | Admitting: Gastroenterology

## 2019-06-18 ENCOUNTER — Encounter: Payer: Self-pay | Admitting: Gerontology

## 2019-06-18 ENCOUNTER — Ambulatory Visit: Payer: Self-pay | Admitting: Gerontology

## 2019-06-18 ENCOUNTER — Other Ambulatory Visit: Payer: Self-pay

## 2019-06-18 ENCOUNTER — Ambulatory Visit: Payer: Self-pay | Admitting: Licensed Clinical Social Worker

## 2019-06-18 VITALS — BP 162/89 | HR 60 | Ht 62.0 in | Wt 178.0 lb

## 2019-06-18 DIAGNOSIS — R899 Unspecified abnormal finding in specimens from other organs, systems and tissues: Secondary | ICD-10-CM | POA: Insufficient documentation

## 2019-06-18 DIAGNOSIS — F410 Panic disorder [episodic paroxysmal anxiety] without agoraphobia: Secondary | ICD-10-CM

## 2019-06-18 DIAGNOSIS — I10 Essential (primary) hypertension: Secondary | ICD-10-CM

## 2019-06-18 DIAGNOSIS — F332 Major depressive disorder, recurrent severe without psychotic features: Secondary | ICD-10-CM

## 2019-06-18 DIAGNOSIS — E785 Hyperlipidemia, unspecified: Secondary | ICD-10-CM | POA: Insufficient documentation

## 2019-06-18 MED ORDER — AMLODIPINE BESYLATE 5 MG PO TABS: 10.0000 mg | ORAL_TABLET | Freq: Every day | ORAL | 0 refills | Status: DC
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
## 2019-06-18 NOTE — BH Specialist Note (Signed)
Integrated Behavioral Health Follow Up Visit Via Phone  MRN: [id number] Name: Nanyamka Raga  Number of [facility] Clinician visits: 2/6  Type of Service: [place] Interpretor:No. Interpretor Name and Language: not applicable.   SUBJECTIVE: Azarria Yturralde is a 53 y.o. female accompanied by herself. Patient was referred by Carlyon Shadow NP for mental health.  Patient reports the following symptoms/concerns: She notes that she did pretty good with managing her anger until Sunday evening and felt like she wanted to lash out at her friend. She explains that she was grocery shopping with her friend due to an employee stopping them in the middle of shopping to price check an item and they had to wait around for awhile for her to return with a response. She notes that she got angry with him because that had to wait around for awhile for a response and it took to long. She explains that she feels like her friend is testing her and doing these things on purpose. She notes that she has been walking a little bit more as one of her healthy outlets. She reports that she has noticed an improvement in her sleep at night. She denies suicidal and homicidal thoughts.  Duration of problem:  Severity of problem: moderate  OBJECTIVE: Mood: Euthymic and Affect: Appropriate Risk of harm to self or others: No plan to harm self or others  LIFE CONTEXT: Family and Social: see above. School/Work: see above. Self-Care: see above. Life Changes: see above.   GOALS ADDRESSED: Patient will: 1.  Reduce symptoms of: agitation  2.  Increase knowledge and/or ability of: coping skills and self-management skills  3.  Demonstrate ability to: Increase healthy adjustment to current life circumstances  INTERVENTIONS: Interventions utilized:  Brief CBT was utilized by the clinician focusing on the patient's anger and triggers. Clinician processed with the patient  regarding how she has been doing since the last follow up session. Clinician discussed with the patient regarding her most recent episode of anger. Clinician asked the patient to reflect on the situation and context of the argument to discern if it was worth getting upset over. Clinician explained to the patient that she could have approached the situation differently by asking her friend, would you mind if I continue shopping while you wait on the store employee for a response. Clinician asked the patient if there was any urgency for them to finish shopping. Clinician encouraged the patient to try to reflect and think about things before she responds.  Standardized Assessments completed: GAD-7 and PHQ 9  ASSESSMENT: Patient currently experiencing see above.   Patient may benefit from see above.  PLAN: 1. Follow up with behavioral health clinician on :  2. Behavioral recommendations:  3. Referral(s): [place] (In Clinic) 4. "From scale of 1-10, how likely are you to follow plan?":   Bayard Hugger, LCSW

## 2019-06-18 NOTE — Patient Instructions (Signed)
Fat and Cholesterol Restricted Eating Plan Getting too much fat and cholesterol in your diet may cause health problems. Choosing the right foods helps keep your fat and cholesterol at normal levels. This can keep you from getting certain diseases. Your doctor may recommend an eating plan that includes:  Total fat: ______% or less of total calories a day.  Saturated fat: ______% or less of total calories a day.  Cholesterol: less than _________mg a day.  Fiber: ______g a day. What are tips for following this plan? Meal planning  At meals, divide your plate into four equal parts: ? Fill one-half of your plate with vegetables and green salads. ? Fill one-fourth of your plate with whole grains. ? Fill one-fourth of your plate with low-fat (lean) protein foods.  Eat fish that is high in omega-3 fats at least two times a week. This includes mackerel, tuna, sardines, and salmon.  Eat foods that are high in fiber, such as whole grains, beans, apples, broccoli, carrots, peas, and barley. General tips   Work with your doctor to lose weight if you need to.  Avoid: ? Foods with added sugar. ? Fried foods. ? Foods with partially hydrogenated oils.  Limit alcohol intake to no more than 1 drink a day for nonpregnant women and 2 drinks a day for men. One drink equals 12 oz of beer, 5 oz of wine, or 1 oz of hard liquor. Reading food labels  Check food labels for: ? Trans fats. ? Partially hydrogenated oils. ? Saturated fat (g) in each serving. ? Cholesterol (mg) in each serving. ? Fiber (g) in each serving.  Choose foods with healthy fats, such as: ? Monounsaturated fats. ? Polyunsaturated fats. ? Omega-3 fats.  Choose grain products that have whole grains. Look for the word "whole" as the first word in the ingredient list. Cooking  Cook foods using low-fat methods. These include baking, boiling, grilling, and broiling.  Eat more home-cooked foods. Eat at restaurants and buffets  less often.  Avoid cooking using saturated fats, such as butter, cream, palm oil, palm kernel oil, and coconut oil. Recommended foods  Fruits  All fresh, canned (in natural juice), or frozen fruits. Vegetables  Fresh or frozen vegetables (raw, steamed, roasted, or grilled). Green salads. Grains  Whole grains, such as whole wheat or whole grain breads, crackers, cereals, and pasta. Unsweetened oatmeal, bulgur, barley, quinoa, or brown rice. Corn or whole wheat flour tortillas. Meats and other protein foods  Ground beef (85% or leaner), grass-fed beef, or beef trimmed of fat. Skinless chicken or [place]. Ground chicken or [place]. Pork trimmed of fat. All fish and seafood. Egg whites. Dried beans, peas, or lentils. Unsalted nuts or seeds. Unsalted canned beans. Nut butters without added sugar or oil. Dairy  Low-fat or nonfat dairy products, such as skim or 1% milk, 2% or reduced-fat cheeses, low-fat and fat-free ricotta or cottage cheese, or plain low-fat and nonfat yogurt. Fats and oils  Tub margarine without trans fats. Light or reduced-fat mayonnaise and salad dressings. Avocado. Olive, canola, sesame, or safflower oils. The items listed above may not be a complete list of foods and beverages you can eat. Contact a dietitian for more information. Foods to avoid Fruits  Canned fruit in heavy syrup. Fruit in cream or butter sauce. Fried fruit. Vegetables  Vegetables cooked in cheese, cream, or butter sauce. Fried vegetables. Grains  White bread. White pasta. White rice. Cornbread. Bagels, pastries, and croissants. Crackers and snack foods that contain trans fat  and hydrogenated oils. °Meats and other protein foods °· Fatty cuts of meat. Ribs, chicken wings, bacon, sausage, bologna, salami, chitterlings, fatback, hot dogs, bratwurst, and packaged lunch meats. Liver and organ meats. Whole eggs and egg yolks. Chicken and turkey with skin. Fried meat. °Dairy °· Whole or 2% milk, cream,  half-and-half, and cream cheese. Whole milk cheeses. Whole-fat or sweetened yogurt. Full-fat cheeses. Nondairy creamers and whipped toppings. Processed cheese, cheese spreads, and cheese curds. °Beverages °· Alcohol. Sugar-sweetened drinks such as sodas, lemonade, and fruit drinks. °Fats and oils °· Butter, stick margarine, lard, shortening, ghee, or bacon fat. Coconut, palm kernel, and palm oils. °Sweets and desserts °· Corn syrup, sugars, honey, and molasses. Candy. Jam and jelly. Syrup. Sweetened cereals. Cookies, pies, cakes, donuts, muffins, and ice cream. °The items listed above may not be a complete list of foods and beverages you should avoid. Contact a dietitian for more information. °Summary °· Choosing the right foods helps keep your fat and cholesterol at normal levels. This can keep you from getting certain diseases. °· At meals, fill one-half of your plate with vegetables and green salads. °· Eat high-fiber foods, like whole grains, beans, apples, carrots, peas, and barley. °· Limit added sugar, saturated fats, alcohol, and fried foods. °This information is not intended to replace advice given to you by your health care provider. Make sure you discuss any questions you have with your health care provider. °Document Released: 03/13/2012 Document Revised: 05/16/2018 Document Reviewed: 05/30/2017 °Elsevier Patient Education © 2020 Elsevier Inc[employer] °DASH Eating Plan °DASH stands for "Dietary Approaches to Stop Hypertension." The DASH eating plan is a healthy eating plan that has been shown to reduce high blood pressure (hypertension). It may also reduce your risk for type 2 diabetes, heart disease, and stroke. The DASH eating plan may also help with weight loss. °What are tips for following this plan? ° °General guidelines °· Avoid eating more than 2,300 mg (milligrams) of salt (sodium) a day. If you have hypertension, you may need to reduce your sodium intake to 1,500 mg a day. °· Limit alcohol intake to no  more than 1 drink a day for nonpregnant women and 2 drinks a day for men. One drink equals 12 oz of beer, 5 oz of wine, or 1½ oz of hard liquor. °· Work with your health care provider to maintain a healthy body weight or to lose weight. Ask what an ideal weight is for you. °· Get at least 30 minutes of exercise that causes your heart to beat faster (aerobic exercise) most days of the week. Activities may include walking, swimming, or biking. °· Work with your health care provider or diet and nutrition specialist (dietitian) to adjust your eating plan to your individual calorie needs. °Reading food labels ° °· Check food labels for the amount of sodium per serving. Choose foods with less than 5 percent of the Daily Value of sodium. Generally, foods with less than 300 mg of sodium per serving fit into this eating plan. °· To find whole grains, look for the word "whole" as the first word in the ingredient list. °Shopping °· Buy products labeled as "low-sodium" or "no salt added." °· Buy fresh foods. Avoid canned foods and premade or frozen meals. °Cooking °· Avoid adding salt when cooking. Use salt-free seasonings or herbs instead of table salt or sea salt. Check with your health care provider or pharmacist before using salt substitutes. °· Do not fry foods. Cook foods using healthy methods such as   baking, boiling, grilling, and broiling instead. °· Cook with heart-healthy oils, such as olive, canola, soybean, or sunflower oil. °Meal planning °· Eat a balanced diet that includes: °? 5 or more servings of fruits and vegetables each day. At each meal, try to fill half of your plate with fruits and vegetables. °? Up to 6-8 servings of whole grains each day. °? Less than 6 oz of lean meat, poultry, or fish each day. A 3-oz serving of meat is about the same size as a deck of cards. One egg equals 1 oz. °? 2 servings of low-fat dairy each day. °? A serving of nuts, seeds, or beans 5 times each week. °? Heart-healthy fats.  Healthy fats called Omega-3 fatty acids are found in foods such as flaxseeds and coldwater fish, like sardines, salmon, and mackerel. °· Limit how much you eat of the following: °? Canned or prepackaged foods. °? Food that is high in trans fat, such as fried foods. °? Food that is high in saturated fat, such as fatty meat. °? Sweets, desserts, sugary drinks, and other foods with added sugar. °? Full-fat dairy products. °· Do not salt foods before eating. °· Try to eat at least 2 vegetarian meals each week. °· Eat more home-cooked food and less restaurant, buffet, and fast food. °· When eating at a restaurant, ask that your food be prepared with less salt or no salt, if possible. °What foods are recommended? °The items listed may not be a complete list. Talk with your dietitian about what dietary choices are best for you. °Grains °Whole-grain or whole-wheat bread. Whole-grain or whole-wheat pasta. Brown rice. Oatmeal. Quinoa. Bulgur. Whole-grain and low-sodium cereals. Pita bread. Low-fat, low-sodium crackers. Whole-wheat flour tortillas. °Vegetables °Fresh or frozen vegetables (raw, steamed, roasted, or grilled). Low-sodium or reduced-sodium tomato and vegetable juice. Low-sodium or reduced-sodium tomato sauce and tomato paste. Low-sodium or reduced-sodium canned vegetables. °Fruits °All fresh, dried, or frozen fruit. Canned fruit in natural juice (without added sugar). °Meat and other protein foods °Skinless chicken or turkey. Ground chicken or turkey. Pork with fat trimmed off. Fish and seafood. Egg whites. Dried beans, peas, or lentils. Unsalted nuts, nut butters, and seeds. Unsalted canned beans. Lean cuts of beef with fat trimmed off. Low-sodium, lean deli meat. °Dairy °Low-fat (1%) or fat-free (skim) milk. Fat-free, low-fat, or reduced-fat cheeses. Nonfat, low-sodium ricotta or cottage cheese. Low-fat or nonfat yogurt. Low-fat, low-sodium cheese. °Fats and oils °Soft margarine without trans fats. Vegetable  oil. Low-fat, reduced-fat, or light mayonnaise and salad dressings (reduced-sodium). Canola, safflower, olive, soybean, and sunflower oils. Avocado. °Seasoning and other foods °Herbs. Spices. Seasoning mixes without salt. Unsalted popcorn and pretzels. Fat-free sweets. °What foods are not recommended? °The items listed may not be a complete list. Talk with your dietitian about what dietary choices are best for you. °Grains °Baked goods made with fat, such as croissants, muffins, or some breads. Dry pasta or rice meal packs. °Vegetables °Creamed or fried vegetables. Vegetables in a cheese sauce. Regular canned vegetables (not low-sodium or reduced-sodium). Regular canned tomato sauce and paste (not low-sodium or reduced-sodium). Regular tomato and vegetable juice (not low-sodium or reduced-sodium). Pickles. Olives. °Fruits °Canned fruit in a light or heavy syrup. Fried fruit. Fruit in cream or butter sauce. °Meat and other protein foods °Fatty cuts of meat. Ribs. Fried meat. Bacon. Sausage. Bologna and other processed lunch meats. Salami. Fatback. Hotdogs. Bratwurst. Salted nuts and seeds. Canned beans with added salt. Canned or smoked fish. Whole eggs or egg yolks.   Chicken or turkey with skin. °Dairy °Whole or 2% milk, cream, and half-and-half. Whole or full-fat cream cheese. Whole-fat or sweetened yogurt. Full-fat cheese. Nondairy creamers. Whipped toppings. Processed cheese and cheese spreads. °Fats and oils °Butter. Stick margarine. Lard. Shortening. Ghee. Bacon fat. Tropical oils, such as coconut, palm kernel, or palm oil. °Seasoning and other foods °Salted popcorn and pretzels. Onion salt, garlic salt, seasoned salt, table salt, and sea salt. Worcestershire sauce. Tartar sauce. Barbecue sauce. Teriyaki sauce. Soy sauce, including reduced-sodium. Steak sauce. Canned and packaged gravies. Fish sauce. Oyster sauce. Cocktail sauce. Horseradish that you find on the shelf. Ketchup. Mustard. Meat flavorings and  tenderizers. Bouillon cubes. Hot sauce and Tabasco sauce. Premade or packaged marinades. Premade or packaged taco seasonings. Relishes. Regular salad dressings. °Where to find more information: °· National Heart, Lung, and Blood Institute: www.nhlbi.nih.gov °· American Heart Association: www.heart.org °Summary °· The DASH eating plan is a healthy eating plan that has been shown to reduce high blood pressure (hypertension). It may also reduce your risk for type 2 diabetes, heart disease, and stroke. °· With the DASH eating plan, you should limit salt (sodium) intake to 2,300 mg a day. If you have hypertension, you may need to reduce your sodium intake to 1,500 mg a day. °· When on the DASH eating plan, aim to eat more fresh fruits and vegetables, whole grains, lean proteins, low-fat dairy, and heart-healthy fats. °· Work with your health care provider or diet and nutrition specialist (dietitian) to adjust your eating plan to your individual calorie needs. °This information is not intended to replace advice given to you by your health care provider. Make sure you discuss any questions you have with your health care provider. °Document Released: 09/01/2011 Document Revised: 08/25/2017 Document Reviewed: 09/05/2016 °Elsevier Patient Education © 2020 Elsevier Inc. ° °

## 2019-06-18 NOTE — Anesthesia Postprocedure Evaluation (Signed)
Anesthesia Post Note  Patient: Juanell Knowlton  Procedure(s) Performed: COLONOSCOPY WITH PROPOFOL (N/A )  Patient location during evaluation: PACU Anesthesia Type: General Level of consciousness: awake and alert Pain management: pain level controlled Vital Signs Assessment: post-procedure vital signs reviewed and stable Respiratory status: spontaneous breathing, nonlabored ventilation, respiratory function stable and patient connected to nasal cannula oxygen Cardiovascular status: blood pressure returned to baseline and stable Postop Assessment: no apparent nausea or vomiting Anesthetic complications: no     Last Vitals:  Vitals:   06/11/19 0914 06/11/19 0924  BP: (!) 149/96 (!) 176/94  Pulse:  (!) 54  Resp: 17 12  Temp: (!) 36.1 C   SpO2: 99% 100%    Last Pain:  Vitals:   06/12/19 0736  TempSrc:   PainSc: 0-No pain                 Molli Barrows

## 2019-06-19 ENCOUNTER — Ambulatory Visit
Admission: RE | Admit: 2019-06-19 | Discharge: 2019-06-19 | Disposition: A | Discharge status: Home / Self Care | Payer: Self-pay | Source: Ambulatory Visit | Attending: Oncology | Admitting: Oncology

## 2019-06-19 ENCOUNTER — Ambulatory Visit: Payer: Self-pay | Attending: Oncology | Admitting: *Deleted

## 2019-06-19 ENCOUNTER — Other Ambulatory Visit: Payer: Self-pay

## 2019-06-19 VITALS — BP 162/73 | HR 56 | Temp 96.8°F | Ht 62.0 in | Wt 179.9 lb

## 2019-06-19 DIAGNOSIS — Z0000 Encounter for general adult medical examination without abnormal findings: Secondary | ICD-10-CM

## 2019-06-19 NOTE — Progress Notes (Signed)
  Subjective:     Patient ID: Breanna Hull, female   DOB: 09-05-1966, 53 y.o.   MRN: [id number]  HPI   Review of Systems     Objective:   Physical Exam Chest:     Breasts:        Right: No swelling, bleeding, inverted nipple, mass, nipple discharge, skin change or tenderness.        Left: No swelling, bleeding, inverted nipple, mass, nipple discharge, skin change or tenderness.       Comments: Large pendulous breast Lymphadenopathy:     Upper Body:     Right upper body: No supraclavicular or axillary adenopathy.     Left upper body: No supraclavicular or axillary adenopathy.        Assessment:     53 year old [place] American female referred to [facility] by [facility] for clinical breast exam and mammogram only.  Clinical breast exam reveals bilateral breast to have a diffuse fibroglandular like pattern.  Taught self breast awareness.  Patient does have a family history of breast cancer in her maternal aunt in her 20's, and her sister with cervical cancer in her 35s.  Patient does not know if her aunt had genetic testing.  Tyrer-Cuzick risk assessment with a 14.5% lifetime risk of breast cancer and a NCCN guideline recommendation for genetic testing.  Discussed with patient option to return to see one of our medical oncologist in our high risk breast clinic for possible genetic counseling and testing.  States she would like to think about it and she will call me back.  Patient has been screened for eligibility.  She does not have any insurance, Medicare or Medicaid.  She also meets financial eligibility.  Hand-out given on the Affordable Care Act.    Plan:     Screening mammogram ordered.  Will follow up per [facility] protocol.  Will anticipate a return phone call from patient with her decision to proceed with genetic counseling.

## 2019-06-20 ENCOUNTER — Encounter: Payer: Self-pay | Admitting: *Deleted

## 2019-06-20 NOTE — Patient Instructions (Signed)
Gave patient hand-out, Women Staying Healthy, Active and Well from [facility], with education on breast health, pap smears, heart and colon health. 

## 2019-06-25 ENCOUNTER — Ambulatory Visit: Payer: Self-pay | Admitting: Licensed Clinical Social Worker

## 2019-06-25 ENCOUNTER — Other Ambulatory Visit: Payer: Self-pay

## 2019-06-25 DIAGNOSIS — F332 Major depressive disorder, recurrent severe without psychotic features: Secondary | ICD-10-CM

## 2019-06-25 DIAGNOSIS — F410 Panic disorder [episodic paroxysmal anxiety] without agoraphobia: Secondary | ICD-10-CM

## 2019-06-25 NOTE — BH Specialist Note (Signed)
Integrated Behavioral Health Follow Up Visit Via Phone  MRN: [id number] Name: Breanna Hull  Number of [facility] Clinician visits: 3/6  Type of Service: [place] Interpretor:No. Interpretor Name and Language: Not applicable.   SUBJECTIVE: Breanna Hull is a 53 y.o. female accompanied by herself. Patient was referred by Carlyon Shadow NP for mental health. Patient reports the following symptoms/concerns: She notes that she has been doing a lot better since the last follow up session. She explains that she is working on accepting things she cannot control versus what she can control. She notes that she tries to take a minute before she becomes reactive and respond in a certain situation. She reports that her mood is better and realizes she cannot completely rely on the medication to fix everything. She notes that she had her mammogram on September 23 rd and had an episode of anxiety that day. She explains that the result was normal. She notes that she went to the mall when it was raining to do her walking and has been trying to walk a little bit more than she was. She notes that last week she visited her grand kids twice and took them lunch twice. She notes that she got together with a friend, watched TV, and talked. She notes that she is trying to figure out what she wants to do in terms of work and would like to find a job that she enjoys. She denies suicidal and homicidal thoughts.  Duration of problem: ; Severity of problem: moderate  OBJECTIVE: Mood: Euthymic and Affect: Appropriate Risk of harm to self or others: No plan to harm self or others  LIFE CONTEXT: Family and Social: see above.  School/Work: see above.  Self-Care: see above.  Life Changes: see above.   GOALS ADDRESSED: Patient will: 1.  Reduce symptoms of: agitation  2.  Increase knowledge and/or ability of: coping skills and self-management skills  3.  Demonstrate  ability to: Increase healthy adjustment to current life circumstances  INTERVENTIONS: Interventions utilized:  Brief CBT was utilized by the clinician focusing on the patient's improvement with her ability to manage her anger. Clinician processed with the patient regarding how she has been doing since the last follow up session. Clinician explained to the patient that it sounds like she has been working on stopping and thinking before she speaks as opposed to becoming reactive to situations. Clinician encouraged the patient to continue with her walks daily and try to work on coming up with more things for enjoyment.  Standardized Assessments completed: next session  ASSESSMENT: Patient currently experiencing see above.   Patient may benefit from see above.  PLAN: 1. Follow up with behavioral health clinician on : one week or earlier if needed. 2. Behavioral recommendations: see above.  3. Referral(s): [place] (In Clinic) 4. "From scale of 1-10, how likely are you to follow plan?":   Bayard Hugger, LCSW

## 2019-07-04 ENCOUNTER — Ambulatory Visit: Payer: Self-pay | Admitting: Licensed Clinical Social Worker

## 2019-07-04 ENCOUNTER — Other Ambulatory Visit: Payer: Self-pay

## 2019-07-04 DIAGNOSIS — F410 Panic disorder [episodic paroxysmal anxiety] without agoraphobia: Secondary | ICD-10-CM

## 2019-07-04 DIAGNOSIS — F332 Major depressive disorder, recurrent severe without psychotic features: Secondary | ICD-10-CM
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
## 2019-07-09 ENCOUNTER — Other Ambulatory Visit: Payer: Self-pay

## 2019-07-09 ENCOUNTER — Encounter: Payer: Self-pay | Admitting: Gerontology

## 2019-07-09 ENCOUNTER — Ambulatory Visit: Payer: Self-pay | Admitting: Gerontology

## 2019-07-09 VITALS — BP 139/79 | HR 71 | Ht 62.0 in | Wt 178.2 lb

## 2019-07-09 DIAGNOSIS — I10 Essential (primary) hypertension: Secondary | ICD-10-CM

## 2019-07-09 DIAGNOSIS — E785 Hyperlipidemia, unspecified: Secondary | ICD-10-CM

## 2019-07-09 MED ORDER — SERTRALINE HCL 50 MG PO TABS: 75.0000 mg | ORAL_TABLET | Freq: Every day | ORAL | 0 refills | Status: DC

## 2019-07-09 MED ORDER — AMLODIPINE BESYLATE 5 MG PO TABS: 10.0000 mg | ORAL_TABLET | Freq: Every day | ORAL | 3 refills | Status: AC
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
## 2019-07-09 NOTE — Patient Instructions (Signed)
Fat and Cholesterol Restricted Eating Plan Getting too much fat and cholesterol in your diet may cause health problems. Choosing the right foods helps keep your fat and cholesterol at normal levels. This can keep you from getting certain diseases. Your doctor may recommend an eating plan that includes:  Total fat: ______% or less of total calories a day.  Saturated fat: ______% or less of total calories a day.  Cholesterol: less than _________mg a day.  Fiber: ______g a day. What are tips for following this plan? Meal planning  At meals, divide your plate into four equal parts: ? Fill one-half of your plate with vegetables and green salads. ? Fill one-fourth of your plate with whole grains. ? Fill one-fourth of your plate with low-fat (lean) protein foods.  Eat fish that is high in omega-3 fats at least two times a week. This includes mackerel, tuna, sardines, and salmon.  Eat foods that are high in fiber, such as whole grains, beans, apples, broccoli, carrots, peas, and barley. General tips   Work with your doctor to lose weight if you need to.  Avoid: ? Foods with added sugar. ? Fried foods. ? Foods with partially hydrogenated oils.  Limit alcohol intake to no more than 1 drink a day for nonpregnant women and 2 drinks a day for men. One drink equals 12 oz of beer, 5 oz of wine, or 1 oz of hard liquor. Reading food labels  Check food labels for: ? Trans fats. ? Partially hydrogenated oils. ? Saturated fat (g) in each serving. ? Cholesterol (mg) in each serving. ? Fiber (g) in each serving.  Choose foods with healthy fats, such as: ? Monounsaturated fats. ? Polyunsaturated fats. ? Omega-3 fats.  Choose grain products that have whole grains. Look for the word "whole" as the first word in the ingredient list. Cooking  Cook foods using low-fat methods. These include baking, boiling, grilling, and broiling.  Eat more home-cooked foods. Eat at restaurants and buffets  less often.  Avoid cooking using saturated fats, such as butter, cream, palm oil, palm kernel oil, and coconut oil. Recommended foods  Fruits  All fresh, canned (in natural juice), or frozen fruits. Vegetables  Fresh or frozen vegetables (raw, steamed, roasted, or grilled). Green salads. Grains  Whole grains, such as whole wheat or whole grain breads, crackers, cereals, and pasta. Unsweetened oatmeal, bulgur, barley, quinoa, or brown rice. Corn or whole wheat flour tortillas. Meats and other protein foods  Ground beef (85% or leaner), grass-fed beef, or beef trimmed of fat. Skinless chicken or [place]. Ground chicken or [place]. Pork trimmed of fat. All fish and seafood. Egg whites. Dried beans, peas, or lentils. Unsalted nuts or seeds. Unsalted canned beans. Nut butters without added sugar or oil. Dairy  Low-fat or nonfat dairy products, such as skim or 1% milk, 2% or reduced-fat cheeses, low-fat and fat-free ricotta or cottage cheese, or plain low-fat and nonfat yogurt. Fats and oils  Tub margarine without trans fats. Light or reduced-fat mayonnaise and salad dressings. Avocado. Olive, canola, sesame, or safflower oils. The items listed above may not be a complete list of foods and beverages you can eat. Contact a dietitian for more information. Foods to avoid Fruits  Canned fruit in heavy syrup. Fruit in cream or butter sauce. Fried fruit. Vegetables  Vegetables cooked in cheese, cream, or butter sauce. Fried vegetables. Grains  White bread. White pasta. White rice. Cornbread. Bagels, pastries, and croissants. Crackers and snack foods that contain trans fat  and hydrogenated oils. °Meats and other protein foods °· Fatty cuts of meat. Ribs, chicken wings, bacon, sausage, bologna, salami, chitterlings, fatback, hot dogs, bratwurst, and packaged lunch meats. Liver and organ meats. Whole eggs and egg yolks. Chicken and turkey with skin. Fried meat. °Dairy °· Whole or 2% milk, cream,  half-and-half, and cream cheese. Whole milk cheeses. Whole-fat or sweetened yogurt. Full-fat cheeses. Nondairy creamers and whipped toppings. Processed cheese, cheese spreads, and cheese curds. °Beverages °· Alcohol. Sugar-sweetened drinks such as sodas, lemonade, and fruit drinks. °Fats and oils °· Butter, stick margarine, lard, shortening, ghee, or bacon fat. Coconut, palm kernel, and palm oils. °Sweets and desserts °· Corn syrup, sugars, honey, and molasses. Candy. Jam and jelly. Syrup. Sweetened cereals. Cookies, pies, cakes, donuts, muffins, and ice cream. °The items listed above may not be a complete list of foods and beverages you should avoid. Contact a dietitian for more information. °Summary °· Choosing the right foods helps keep your fat and cholesterol at normal levels. This can keep you from getting certain diseases. °· At meals, fill one-half of your plate with vegetables and green salads. °· Eat high-fiber foods, like whole grains, beans, apples, carrots, peas, and barley. °· Limit added sugar, saturated fats, alcohol, and fried foods. °This information is not intended to replace advice given to you by your health care provider. Make sure you discuss any questions you have with your health care provider. °Document Released: 03/13/2012 Document Revised: 05/16/2018 Document Reviewed: 05/30/2017 °Elsevier Patient Education © 2020 Elsevier Inc[employer] °DASH Eating Plan °DASH stands for "Dietary Approaches to Stop Hypertension." The DASH eating plan is a healthy eating plan that has been shown to reduce high blood pressure (hypertension). It may also reduce your risk for type 2 diabetes, heart disease, and stroke. The DASH eating plan may also help with weight loss. °What are tips for following this plan? ° °General guidelines °· Avoid eating more than 2,300 mg (milligrams) of salt (sodium) a day. If you have hypertension, you may need to reduce your sodium intake to 1,500 mg a day. °· Limit alcohol intake to no  more than 1 drink a day for nonpregnant women and 2 drinks a day for men. One drink equals 12 oz of beer, 5 oz of wine, or 1½ oz of hard liquor. °· Work with your health care provider to maintain a healthy body weight or to lose weight. Ask what an ideal weight is for you. °· Get at least 30 minutes of exercise that causes your heart to beat faster (aerobic exercise) most days of the week. Activities may include walking, swimming, or biking. °· Work with your health care provider or diet and nutrition specialist (dietitian) to adjust your eating plan to your individual calorie needs. °Reading food labels ° °· Check food labels for the amount of sodium per serving. Choose foods with less than 5 percent of the Daily Value of sodium. Generally, foods with less than 300 mg of sodium per serving fit into this eating plan. °· To find whole grains, look for the word "whole" as the first word in the ingredient list. °Shopping °· Buy products labeled as "low-sodium" or "no salt added." °· Buy fresh foods. Avoid canned foods and premade or frozen meals. °Cooking °· Avoid adding salt when cooking. Use salt-free seasonings or herbs instead of table salt or sea salt. Check with your health care provider or pharmacist before using salt substitutes. °· Do not fry foods. Cook foods using healthy methods such as   baking, boiling, grilling, and broiling instead. °· Cook with heart-healthy oils, such as olive, canola, soybean, or sunflower oil. °Meal planning °· Eat a balanced diet that includes: °? 5 or more servings of fruits and vegetables each day. At each meal, try to fill half of your plate with fruits and vegetables. °? Up to 6-8 servings of whole grains each day. °? Less than 6 oz of lean meat, poultry, or fish each day. A 3-oz serving of meat is about the same size as a deck of cards. One egg equals 1 oz. °? 2 servings of low-fat dairy each day. °? A serving of nuts, seeds, or beans 5 times each week. °? Heart-healthy fats.  Healthy fats called Omega-3 fatty acids are found in foods such as flaxseeds and coldwater fish, like sardines, salmon, and mackerel. °· Limit how much you eat of the following: °? Canned or prepackaged foods. °? Food that is high in trans fat, such as fried foods. °? Food that is high in saturated fat, such as fatty meat. °? Sweets, desserts, sugary drinks, and other foods with added sugar. °? Full-fat dairy products. °· Do not salt foods before eating. °· Try to eat at least 2 vegetarian meals each week. °· Eat more home-cooked food and less restaurant, buffet, and fast food. °· When eating at a restaurant, ask that your food be prepared with less salt or no salt, if possible. °What foods are recommended? °The items listed may not be a complete list. Talk with your dietitian about what dietary choices are best for you. °Grains °Whole-grain or whole-wheat bread. Whole-grain or whole-wheat pasta. Brown rice. Oatmeal. Quinoa. Bulgur. Whole-grain and low-sodium cereals. Pita bread. Low-fat, low-sodium crackers. Whole-wheat flour tortillas. °Vegetables °Fresh or frozen vegetables (raw, steamed, roasted, or grilled). Low-sodium or reduced-sodium tomato and vegetable juice. Low-sodium or reduced-sodium tomato sauce and tomato paste. Low-sodium or reduced-sodium canned vegetables. °Fruits °All fresh, dried, or frozen fruit. Canned fruit in natural juice (without added sugar). °Meat and other protein foods °Skinless chicken or turkey. Ground chicken or turkey. Pork with fat trimmed off. Fish and seafood. Egg whites. Dried beans, peas, or lentils. Unsalted nuts, nut butters, and seeds. Unsalted canned beans. Lean cuts of beef with fat trimmed off. Low-sodium, lean deli meat. °Dairy °Low-fat (1%) or fat-free (skim) milk. Fat-free, low-fat, or reduced-fat cheeses. Nonfat, low-sodium ricotta or cottage cheese. Low-fat or nonfat yogurt. Low-fat, low-sodium cheese. °Fats and oils °Soft margarine without trans fats. Vegetable  oil. Low-fat, reduced-fat, or light mayonnaise and salad dressings (reduced-sodium). Canola, safflower, olive, soybean, and sunflower oils. Avocado. °Seasoning and other foods °Herbs. Spices. Seasoning mixes without salt. Unsalted popcorn and pretzels. Fat-free sweets. °What foods are not recommended? °The items listed may not be a complete list. Talk with your dietitian about what dietary choices are best for you. °Grains °Baked goods made with fat, such as croissants, muffins, or some breads. Dry pasta or rice meal packs. °Vegetables °Creamed or fried vegetables. Vegetables in a cheese sauce. Regular canned vegetables (not low-sodium or reduced-sodium). Regular canned tomato sauce and paste (not low-sodium or reduced-sodium). Regular tomato and vegetable juice (not low-sodium or reduced-sodium). Pickles. Olives. °Fruits °Canned fruit in a light or heavy syrup. Fried fruit. Fruit in cream or butter sauce. °Meat and other protein foods °Fatty cuts of meat. Ribs. Fried meat. Bacon. Sausage. Bologna and other processed lunch meats. Salami. Fatback. Hotdogs. Bratwurst. Salted nuts and seeds. Canned beans with added salt. Canned or smoked fish. Whole eggs or egg yolks.   Chicken or turkey with skin. °Dairy °Whole or 2% milk, cream, and half-and-half. Whole or full-fat cream cheese. Whole-fat or sweetened yogurt. Full-fat cheese. Nondairy creamers. Whipped toppings. Processed cheese and cheese spreads. °Fats and oils °Butter. Stick margarine. Lard. Shortening. Ghee. Bacon fat. Tropical oils, such as coconut, palm kernel, or palm oil. °Seasoning and other foods °Salted popcorn and pretzels. Onion salt, garlic salt, seasoned salt, table salt, and sea salt. Worcestershire sauce. Tartar sauce. Barbecue sauce. Teriyaki sauce. Soy sauce, including reduced-sodium. Steak sauce. Canned and packaged gravies. Fish sauce. Oyster sauce. Cocktail sauce. Horseradish that you find on the shelf. Ketchup. Mustard. Meat flavorings and  tenderizers. Bouillon cubes. Hot sauce and Tabasco sauce. Premade or packaged marinades. Premade or packaged taco seasonings. Relishes. Regular salad dressings. °Where to find more information: °· National Heart, Lung, and Blood Institute: www.nhlbi.nih.gov °· American Heart Association: www.heart.org °Summary °· The DASH eating plan is a healthy eating plan that has been shown to reduce high blood pressure (hypertension). It may also reduce your risk for type 2 diabetes, heart disease, and stroke. °· With the DASH eating plan, you should limit salt (sodium) intake to 2,300 mg a day. If you have hypertension, you may need to reduce your sodium intake to 1,500 mg a day. °· When on the DASH eating plan, aim to eat more fresh fruits and vegetables, whole grains, lean proteins, low-fat dairy, and heart-healthy fats. °· Work with your health care provider or diet and nutrition specialist (dietitian) to adjust your eating plan to your individual calorie needs. °This information is not intended to replace advice given to you by your health care provider. Make sure you discuss any questions you have with your health care provider. °Document Released: 09/01/2011 Document Revised: 08/25/2017 Document Reviewed: 09/05/2016 °Elsevier Patient Education © 2020 Elsevier Inc. ° °

## 2019-07-18 ENCOUNTER — Other Ambulatory Visit: Payer: Self-pay

## 2019-07-18 ENCOUNTER — Ambulatory Visit: Payer: Self-pay | Admitting: Licensed Clinical Social Worker

## 2019-07-18 DIAGNOSIS — F410 Panic disorder [episodic paroxysmal anxiety] without agoraphobia: Secondary | ICD-10-CM

## 2019-07-18 DIAGNOSIS — F332 Major depressive disorder, recurrent severe without psychotic features: Secondary | ICD-10-CM
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
## 2019-07-19 ENCOUNTER — Ambulatory Visit: Payer: Self-pay | Admitting: Pharmacy Technician

## 2019-07-19 DIAGNOSIS — Z79899 Other long term (current) drug therapy: Secondary | ICD-10-CM

## 2019-07-19 NOTE — Progress Notes (Signed)
Provided patient with financial assistance application for [place] due to recent hospital visit.  Patient agreed to be responsible for completion of the application and for gathering financial information and forwarding to appropriate department in [facility].    Completed [facility] application over the phone.  Patient verbally agreed to [facility] contract.  Mailing to patient to sign.  Patient approved to receive medication assistance at [facility] as long as eligibility criteria continues to be met.    Provided patient with community resource material based on her particular needs.    [place] [facility]

## 2019-08-01 ENCOUNTER — Ambulatory Visit: Payer: Self-pay | Admitting: Licensed Clinical Social Worker

## 2019-08-01 ENCOUNTER — Other Ambulatory Visit: Payer: Self-pay

## 2019-08-01 DIAGNOSIS — F332 Major depressive disorder, recurrent severe without psychotic features: Secondary | ICD-10-CM

## 2019-08-01 DIAGNOSIS — F410 Panic disorder [episodic paroxysmal anxiety] without agoraphobia: Secondary | ICD-10-CM

## 2019-08-01 NOTE — BH Specialist Note (Signed)
Integrated Behavioral Health Follow Up Visit Via Phone  MRN: [id number] Name: Breanna Hull  Type of Service: [place] Interpretor:No. Interpretor Name and Language: not applicable.   SUBJECTIVE: Breanna Hull is a 53 y.o. female accompanied by herself. Patient was referred by Carlyon Shadow NP for mental health.  Patient reports the following symptoms/concerns: She notes that she got confused last week because she thought her appointment was last week and not this week. She notes that she previously has been unable to drive on the highway and was able to follow her friend on the interstate to his new house. She explains that it was an hour and thirty minute drive. She explains that she only had to say once to herself that its mind over matter and that she can do this. She explains that she decided to come up there for a little while to hang out, get away, and have some time away from everything. She notes that things with her mood are off and on. She explains that she had a great birthday and the day after she felt down. She explains that she felt like she needed something different to be able to get away and feel refreshed again. She notes that she is still doing well on the Sertraline 75 mg daily and denies any side effects. She denies suicidal and homicidal thoughts.  Duration of problem: ; Severity of problem: mild  OBJECTIVE: Mood: Euthymic and Affect: Appropriate Risk of harm to self or others: No plan to harm self or others  LIFE CONTEXT: Family and Social: see above.  School/Work: see above. Self-Care: see above. Life Changes: see above.  GOALS ADDRESSED: Patient will: 1.  Reduce symptoms of: depression  2.  Increase knowledge and/or ability of: self-management skills  3.  Demonstrate ability to: Increase healthy adjustment to current life circumstances  INTERVENTIONS: Interventions utilized:  Brief CBT was utilized by the clinician  focusing on the patient's mood and affect on behavior. Clinician processed with the patient regarding how she has been doing since the last follow up session. Clinician congratulated the patient on being able [place] from where she lives to [place] and wished her a happy belated birthday. Clinician explained to the patient that she thinks it was a good idea to get away and have a change of scenery for a couple of days. Clinician explained to the patient that its possible her mood was off the day following her birthday because normally she spend it with her family and chose [place] time [place] the [place] instead. Clinician explained to the patient that in terms of whether or not to take the step with her friend is up to her. Clinician suggested that the patient make a pro and con list to determine whether or not she should move in with her friend.  Standardized Assessments completed: GAD-7 and PHQ 9  ASSESSMENT: Patient currently experiencing see above.   Patient may benefit from see above.   PLAN: 1. Follow up with behavioral health clinician on :  2. Behavioral recommendations: see above. 3. Referral(s): [place] (In Clinic) 4. "From scale of 1-10, how likely are you to follow plan?":   Bayard Hugger, LCSW

## 2019-08-15 ENCOUNTER — Other Ambulatory Visit: Payer: Self-pay

## 2019-08-15 ENCOUNTER — Ambulatory Visit: Payer: Self-pay | Admitting: Licensed Clinical Social Worker

## 2019-08-15 DIAGNOSIS — F332 Major depressive disorder, recurrent severe without psychotic features: Secondary | ICD-10-CM

## 2019-08-15 DIAGNOSIS — F410 Panic disorder [episodic paroxysmal anxiety] without agoraphobia: Secondary | ICD-10-CM

## 2019-08-15 NOTE — BH Specialist Note (Signed)
Integrated Behavioral Health Follow Up Visit Via Phone  MRN: [id number] Name: Breanna Hull  Type of Service: [place] Interpretor:No. Interpretor Name and Language: Not applicable.   SUBJECTIVE: Breanna Hull is a 53 y.o. female accompanied by herself. Patient was referred by  Patient reports the following symptoms/concerns: She notes that she has been doing a lot better. 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 She explains that she wants a change and is thinking of moving in with her friend. She notes that she feels like she needs a fresh start and wants to see if this will work out. She reports that her back up plan is that her son told her that she can stay with him and her daughter in law if things do not work out living with her friend. She notes that her plan is to move out of her apartment by the end of the month. She notes that although she is moving with her friend that she still plans to find a job. She asked if there was a test to determine if she was bipolar due to her friend saying that when she becomes angry that she gets out of control. She denies suicidal and homicidal thoughts.  Duration of problem: ; Severity of problem: mild  OBJECTIVE: Mood: Euthymic and Affect: Appropriate Risk of harm to self or others: No plan to harm self or others  LIFE CONTEXT: Family and Social: see above. School/Work: see above. Self-Care: see above. Life Changes: see above.  GOALS ADDRESSED: Patient will: 1.  Reduce symptoms of: agitation  2.  Increase knowledge and/or ability of: coping skills and self-management skills  3.  Demonstrate ability to: Increase healthy adjustment to current life circumstances  INTERVENTIONS: Interventions utilized:  Brief CBT was utilized by the clinician focusing on the patient's episodes of anger and affect on her behavior. Clinician processed with the  patient regarding how she has been doing since the last follow up session. Clinician explained to the patient that she thinks she is making a good decision and has a back up plan in case it does not work out. Clinician provided psycho education regarding Bipolar type 1 and Bipolar type II with the different features. Clinician provided the patient with coping skills to utilize when angry; deep breathing, guided imagery, progressive muscle relaxation, and socratic questioning. Clinician encouraged the patient to seek out healthy outlets to channel her anger.  Standardized Assessments completed: GAD-7 and PHQ 9  ASSESSMENT: Patient currently experiencing  see above.  Patient may benefit from see above.  PLAN: 1. Follow up with behavioral health clinician on : two weeks or earlier if needed. 2. Behavioral recommendations: see above. 3. Referral(s): [place] (In Clinic) 4. "From scale of 1-10, how likely are you to follow plan?":   Bayard Hugger, LCSW

## 2019-08-29 ENCOUNTER — Ambulatory Visit: Payer: Self-pay | Admitting: Licensed Clinical Social Worker

## 2019-08-29 ENCOUNTER — Other Ambulatory Visit: Payer: Self-pay

## 2019-08-29 DIAGNOSIS — F410 Panic disorder [episodic paroxysmal anxiety] without agoraphobia: Secondary | ICD-10-CM

## 2019-08-29 DIAGNOSIS — F332 Major depressive disorder, recurrent severe without psychotic features: Secondary | ICD-10-CM

## 2019-08-29 MED ORDER — SERTRALINE HCL 50 MG PO TABS: 75.0000 mg | ORAL_TABLET | Freq: Every day | ORAL | 2 refills | Status: DC

## 2019-08-29 NOTE — BH Specialist Note (Signed)
Integrated Behavioral Health Follow Up Visit Via Phone  MRN: [id number] Name: Breanna Hull  Type of Service: [place] Interpretor:No. Interpretor Name and Language: not applicable.  SUBJECTIVE: Breanna Hull is a 53 y.o. female accompanied by herself. Patient was referred by Carlyon Shadow NP for mental health. Patient reports the following symptoms/concerns: She notes that she ended up talking to her kids on video for Thanksgiving. She reports that she moved in with her friend last week and is not sure if it will be permanent or not. She notes that she moved out of her apartment and put most of her stuff in storage. She notes that she has been enjoying things so far, messing around in the yard and being outside. She notes that she is keeping busy. She explains that she felt down at Thanksgiving at first but her family did not really do anything anyway. She explains that she has been concerned about her sister's health problems and is trying to be positive each time they speak. She notes that lately she has been wanting more sweets lately and it tends to happen over the holidays. She denies suicidal and homicidal thoughts. Duration of problem: ; Severity of problem: mild  OBJECTIVE: Mood: Euthymic and Affect: Appropriate Risk of harm to self or others: No plan to harm self or others  LIFE CONTEXT: Family and Social: see above. School/Work: see above. Self-Care: see above. Life Changes: see above.  GOALS ADDRESSED: Patient will: 1.  Reduce symptoms of: relapse prevention  2.  Increase knowledge and/or ability of: coping skills and self-management skills  3.  Demonstrate ability to: Increase healthy adjustment to current life circumstances  INTERVENTIONS: Interventions utilized:  Brief CBT was utilized by the clinician focusing on the patient's improvement in her mood symptoms. Clinician processed with the patient regarding how she has been doing  since the last follow up session. Clinician explained to the patient that she is glad to hear that although she made the decision to move that she has a back up plan in case things do not work out. Clinician gave the patient her thirty day notice. Clinician provided the patient with the name of a primary care clinic that is free and a behavioral health facility in [place]. Clinician explained to the patient that it sounds like gardening and staying active has improved her mental health.  Standardized Assessments completed: GAD-7 and PHQ 9  ASSESSMENT: Patient currently experiencing see above.   Patient may benefit from see above.  PLAN: 1. Follow up with behavioral health clinician on : two weeks or earlier if needed. 2. Behavioral recommendations: see above. 3. Referral(s): [place] (In Clinic) 4. "From scale of 1-10, how likely are you to follow plan?":   Bayard Hugger, LCSW

## 2019-09-02 ENCOUNTER — Encounter: Payer: Self-pay | Admitting: *Deleted

## 2019-09-12 ENCOUNTER — Ambulatory Visit: Payer: Self-pay | Admitting: Licensed Clinical Social Worker

## 2019-09-12 ENCOUNTER — Other Ambulatory Visit: Payer: Self-pay

## 2019-09-12 ENCOUNTER — Telehealth: Payer: Self-pay | Admitting: Licensed Clinical Social Worker

## 2019-09-12 DIAGNOSIS — F410 Panic disorder [episodic paroxysmal anxiety] without agoraphobia: Secondary | ICD-10-CM

## 2019-09-12 DIAGNOSIS — F332 Major depressive disorder, recurrent severe without psychotic features: Secondary | ICD-10-CM

## 2019-09-12 NOTE — BH Specialist Note (Signed)
Integrated Behavioral Health Follow Up Visit Via Phone  MRN: [id number] Name: Breanna Hull  Type of Service: [place] Interpretor:No. Interpretor Name and Language: Not applicable.   SUBJECTIVE: Breanna Hull is a 53 y.o. female accompanied by herself. Patient was referred by Carlyon Shadow NP for mental health. Patient reports the following symptoms/concerns: She explains that she is feeling like she needs to move back home, find a job, and be on her own so she is not a burden to anyone. She notes that she feels like she needs to go be by herself because she is still not over things from the past and is not good enough. She explains that she feels like she does not care about doing anything and has yet to contact the resources that she was given at the last follow up session. She reports that she has been going by this church and a counselor there asked her to reach out to her before she makes a decision. She notes that she went into a couple counseling session with the pastor and her friend. She notes that she has called this counselor a few times for support. She notes that she is not taking her medication every day and sometimes she forgets to take it. She notes that she is still getting out to garden daily and tries to stay busy. She denies suicidal and homicidal thoughts.  Duration of problem: ; Severity of problem: moderate  OBJECTIVE: Mood: Euthymic and Affect: Appropriate Risk of harm to self or others: No plan to harm self or others  LIFE CONTEXT: Family and Social: see above.  School/Work: see above. Self-Care: see above. Life Changes: see above.  GOALS ADDRESSED: Patient will: 1.  Reduce symptoms of: depression  2.  Increase knowledge and/or ability of: stress reduction  3.  Demonstrate ability to: Increase motivation to adhere to plan of care and Improve medication compliance  INTERVENTIONS: Interventions utilized:  Supportive  Counseling was utilized by the clinician focusing on the patient's mood. Clinician processed with the patient regarding how she has been doing since the last follow up session. Clinician discussed with the patient has anything negative happened in the last few weeks that have contributed to her change in mood symptoms. Clinician encouraged the patient to take the Sertraline 75 mg daily as prescribed to be able to receive the full benefits of the medication. Clinician suggested that the patient get either a pill box, or set a reminder on her cell phone to remember to take her medication. Clinician explained to the patient that she thinks part of her mood is the fact that she has been out of work since the pandemic began and misses have a routine. Clinician explained to the patient that she will have case consultation with Dr. Octavia Heir, MD on Tuesday December 22nd @ 9 am to discuss her symptoms and potential adjustments with her medications.  Standardized Assessments completed: GAD-7 and PHQ 9  ASSESSMENT: Patient currently experiencing see above.  Patient may benefit from see above.  PLAN: 1. Follow up with behavioral health clinician on : two weeks or earlier if needed. 2. Behavioral recommendations: see above. 3. Referral(s): [place] (In Clinic) 4. "From scale of 1-10, how likely are you to follow plan?":   Bayard Hugger, LCSW

## 2019-09-12 NOTE — Telephone Encounter (Signed)
Clinician attempted to contact the patient twice during their scheduled phone visit but there was not an option to leave a voicemail.

## 2019-09-17 ENCOUNTER — Other Ambulatory Visit: Payer: Self-pay

## 2019-09-17 MED ORDER — SERTRALINE HCL 100 MG PO TABS: 100.0000 mg | ORAL_TABLET | Freq: Every day | ORAL | 0 refills | Status: AC

## 2019-09-24 ENCOUNTER — Ambulatory Visit: Payer: Self-pay | Admitting: Licensed Clinical Social Worker

## 2019-09-24 ENCOUNTER — Other Ambulatory Visit: Payer: Self-pay

## 2019-09-24 DIAGNOSIS — F332 Major depressive disorder, recurrent severe without psychotic features: Secondary | ICD-10-CM

## 2019-09-24 DIAGNOSIS — F410 Panic disorder [episodic paroxysmal anxiety] without agoraphobia: Secondary | ICD-10-CM

## 2019-09-24 NOTE — BH Specialist Note (Signed)
Integrated Behavioral Health Follow Up Visit Via Phone  MRN: [id number] Name: Ervin Nickoloff  Type of Service: [place] Interpretor:No. Interpretor Name and Language: not applicable.   SUBJECTIVE: Kimberlee Sicking is a 53 y.o. female accompanied by herself. Patient was referred by Carlyon Shadow NP for mental health. Patient reports the following symptoms/concerns: She notes that she is still contemplating what she wants to do. She reports that she has an appointment with [facility] on January 8th in [place]. She notes that she is still talking to the therapist on and off at the church in [place]. She notes that she is trying to find a job to get some income because her unemployment ended in November. She notes that she took it out on her friend, went to the other room, and shut down. She explains that she is getting four to five hours a sleep a night. She notes that she has good days and bad days in terms of her mood. She notes that the anxiety and panic attacks are centered around her driving. She denies suicidal and homicidal thoughts.  Duration of problem: ; Severity of problem: moderate  OBJECTIVE: Mood: Euthymic and Affect: Appropriate Risk of harm to self or others: No plan to harm self or others  LIFE CONTEXT: Family and Social: see above. School/Work: see above.  Self-Care: see above. Life Changes: see above.  GOALS ADDRESSED: Patient will: 1.  Reduce symptoms of: mood instability  2.  Increase knowledge and/or ability of: coping skills, self-management skills and stress reduction  3.  Demonstrate ability to: Increase healthy adjustment to current life circumstances  INTERVENTIONS: Interventions utilized:  Supportive Counseling was utilized by the clinician during today's follow up session. Clinician processed with the patient regarding how she has been doing since the last follow up session. Clinician explained to the patient that its a  conflict of interest for her to see two therapists for counseling. Clinician explained to the patient that it sounds like the support of women who have been through similar things in their life and this pastoral counselor has been beneficial for her. Clinician explained to the patient that the psychiatric consultant Dr. Octavia Heir, MD increased the dosage of her Zoloft from 75 mg to 100 mg daily and it was sent electronically to the [facility] in [place].  Standardized Assessments completed: GAD-7 and PHQ 9  ASSESSMENT: Patient currently experiencing see above.   Patient may benefit from see above.  PLAN: 1. Follow up with behavioral health clinician on : two weeks or earlier if needed.  2. Behavioral recommendations: see above. 3. Referral(s): [place] (In Clinic) 4. "From scale of 1-10, how likely are you to follow plan?":   Bayard Hugger, LCSW

## 2019-10-02 ENCOUNTER — Other Ambulatory Visit: Payer: Self-pay

## 2019-10-08 ENCOUNTER — Ambulatory Visit: Payer: Self-pay | Admitting: Gerontology

## 2019-10-08 ENCOUNTER — Other Ambulatory Visit: Payer: Self-pay

## 2019-10-08 ENCOUNTER — Ambulatory Visit: Payer: Self-pay | Admitting: Licensed Clinical Social Worker

## 2019-10-08 DIAGNOSIS — F410 Panic disorder [episodic paroxysmal anxiety] without agoraphobia: Secondary | ICD-10-CM

## 2019-10-08 DIAGNOSIS — F332 Major depressive disorder, recurrent severe without psychotic features: Secondary | ICD-10-CM

## 2019-10-08 NOTE — BH Specialist Note (Signed)
Integrated Behavioral Health Follow Up Visit Via Phone  MRN: [id number] Name: Breanna Hull  Type of Service: [place] Interpretor:No. Interpretor Name and Language: not applicable.   SUBJECTIVE: Breanna Hull is a 54 y.o. female accompanied by herself. Patient was referred by Carlyon Shadow NP for mental health. Patient reports the following symptoms/concerns: She explains that she had a mental health assessment at [facility] and has a follow up scheduled on the 19th with a therapist. She notes that she was told that [employer] has a program where they can help you find employment. She notes that she is still trying to decide if she will stay with her friend or not. She notes that when she becomes agitated or stressed that she will go to her room watch something on TV, listen to music, and relax. She denies suicidal and homicidal thoughts.  Duration of problem: ; Severity of problem: mild  OBJECTIVE: Mood: Euthymic and Affect: Appropriate Risk of harm to self or others: No plan to harm self or others  LIFE CONTEXT: Family and Social: see above. School/Work: see above. Self-Care: see above. Life Changes: see above.  GOALS ADDRESSED: Patient will: 1.  Reduce symptoms of: anxiety  2.  Increase knowledge and/or ability of: coping skills and self-management skills  3.  Demonstrate ability to: Increase healthy adjustment to current life circumstances  INTERVENTIONS: Interventions utilized:  Supportive Counseling was utilized by the clinician during today's follow up session. Clinician processed with the patient regarding how she has been doing since the last follow up session. Clinician discussed with the patient regarding how things went with [facility]. Clinician asked the patient if she has made a decision in regards to whether or not she will say where she is or move back. Clinician encouraged the patient to continue to utilize the coping skills that works  for her.  Standardized Assessments completed: GAD-7 and PHQ 9  ASSESSMENT: Patient currently experiencing see above.  Patient may benefit from see above.  PLAN: 1. Follow up with behavioral health clinician on : two weeks or earlier if needed.  2. Behavioral recommendations: see above. 3. Referral(s): [place] (In Clinic) 4. "From scale of 1-10, how likely are you to follow plan?":   Bayard Hugger, LCSW

## 2019-10-22 ENCOUNTER — Ambulatory Visit: Payer: Self-pay | Admitting: Licensed Clinical Social Worker

## 2019-10-22 ENCOUNTER — Other Ambulatory Visit: Payer: Self-pay

## 2019-10-22 DIAGNOSIS — F332 Major depressive disorder, recurrent severe without psychotic features: Secondary | ICD-10-CM

## 2019-10-22 DIAGNOSIS — F410 Panic disorder [episodic paroxysmal anxiety] without agoraphobia: Secondary | ICD-10-CM

## 2019-10-22 NOTE — BH Specialist Note (Signed)
Integrated Behavioral Health Follow Up Visit Via Phone  MRN: [id number] Name: Breanna Hull  Type of Service: [place] Interpretor:No. Interpretor Name and Language: not applicable.   SUBJECTIVE: Breanna Hull is a 54 y.o. female accompanied by herself. Patient was referred by Carlyon Shadow NP for mental health. Patient reports the following symptoms/concerns: She reports that she had a zoom meeting with a therapist at [facility] last week. She explains that the therapist said she wanted her to do group therapy at least once a week and see her once a month for individual sessions. She notes that the first session that she did not care for but plans to try it one more time to see if it will be a little bit better. She notes that she has been talking with her friend about whether or not its a good idea for her to stay. She discussed concerns for a family member and her relationship problems. She explained that she tried to give this family member advice but it did not go over well. She notes that she has gained 14 pounds since she moved and is trying to exercise with her friend. She notes that she has not been able to go outside as much due to the weather. She denies suicidal and homicidal thoughts. Duration of problem:  Severity of problem: mild  OBJECTIVE: Mood: Euthymic and Affect: Appropriate Risk of harm to self or others: No plan to harm self or others  LIFE CONTEXT: Family and Social: see above. School/Work:see above. Self-Care: see above. Life Changes: see above.  GOALS ADDRESSED: Patient will: 1.  Reduce symptoms of: anxiety and depression  2.  Increase knowledge and/or ability of: coping skills and healthy habits  3.  Demonstrate ability to: Increase healthy adjustment to current life circumstances  INTERVENTIONS: Interventions utilized:  Supportive Counseling was utilized by the clinician during today's follow up session. Clinician processed  with the patient regarding how she has been doing since the last follow up session. Clinician explained to the patient that it sounds like she has more of plan to decide whether or not to stay where she is at until the end of February. Clinician explained to the patient that in terms of the situation with her family member that all she can do is be supportive and allow her to choose what she is going to do. Clinician explained to the patient that its up to this family member to decide what is best for her. Clinician suggested that they touch base in two weeks to see where she is at in the decision making process. Standardized Assessments completed: GAD-7 and PHQ 9  ASSESSMENT: Patient currently experiencing see above.   Patient may benefit from see above.  PLAN: 1. Follow up with behavioral health clinician on :  2. Behavioral recommendations: see above.  3. Referral(s): [place] (In Clinic) 4. "From scale of 1-10, how likely are you to follow plan?":   Bayard Hugger, LCSW

## 2019-11-05 ENCOUNTER — Ambulatory Visit: Payer: Self-pay | Admitting: Licensed Clinical Social Worker

## 2019-11-05 ENCOUNTER — Other Ambulatory Visit: Payer: Self-pay

## 2019-11-05 DIAGNOSIS — F410 Panic disorder [episodic paroxysmal anxiety] without agoraphobia: Secondary | ICD-10-CM

## 2019-11-05 DIAGNOSIS — F332 Major depressive disorder, recurrent severe without psychotic features: Secondary | ICD-10-CM

## 2019-11-05 NOTE — BH Specialist Note (Signed)
Integrated Behavioral Health Follow Up Visit Via Phone  MRN: [id number] Name: Breanna Hull  Type of Service: [place] Interpretor:No. Interpretor Name and Language:   SUBJECTIVE: Breanna Hull is a 54 y.o. female accompanied by herself. Patient was referred by Carlyon Shadow, NP for mental health. Patient reports the following symptoms/concerns: She reports that she has being doing a lot better in the last two weeks. She notes that attending a support group at the church has helped her figure some things out. She reports that she has been reading a book by Idelle Crouch about trust and its been helpful. She explains that she has been getting on the tread mill each morning to get some exercise. She notes that she is trying to get her diet under control and exercise more often. She notes that she attended a program at [facility] for assessment for different resources and is awaiting a return call. She explains that she has set healthy boundaries with her friend. She denies suicidal and homicidal thoughts.  Duration of problem: ; Severity of problem: mild  OBJECTIVE: Mood: Euthymic and Affect: Appropriate Risk of harm to self or others: No plan to harm self or others  LIFE CONTEXT: Family and Social: see above. School/Work: see above. Self-Care: see above. Life Changes: see above.  GOALS ADDRESSED: Patient will: 1.  Reduce symptoms of: anxiety and depression  2.  Increase knowledge and/or ability of: coping skills, healthy habits and self-management skills  3.  Demonstrate ability to: Increase healthy adjustment to current life circumstances  INTERVENTIONS: Interventions utilized:  Supportive Counseling was utilized by the clinician during today's session. Clinician processed with the patient regarding how she has been doing since the last follow up session. Clinician explained to the patient that it sounds like she is being more proactive at practicing  self care, making lifestyle changes, and looking into resources that can better herself. Clinician explained to the patient that she is glad that she was able to set a clear boundary with her friend when it comes to her mental health and doing what she needs to do to get better. Clinician explained to the patient that it sounds like her friend has preconceived notions about what her diagnosis is and what he thinks she needs to do that is best for her but it the end of the day its up to her to make those decisions for herself. Standardized Assessments completed: GAD-7 and PHQ 9  ASSESSMENT: Patient currently experiencing see above.  Patient may benefit from see above.  PLAN: 1. Follow up with behavioral health clinician on : two weeks or earlier if needed. 2. Behavioral recommendations: see above. 3. Referral(s): [place] (In Clinic) 4. "From scale of 1-10, how likely are you to follow plan?":   Bayard Hugger, LCSW

## 2019-11-19 ENCOUNTER — Other Ambulatory Visit: Payer: Self-pay

## 2019-11-19 ENCOUNTER — Ambulatory Visit: Payer: Self-pay | Admitting: Licensed Clinical Social Worker

## 2019-11-19 DIAGNOSIS — F332 Major depressive disorder, recurrent severe without psychotic features: Secondary | ICD-10-CM

## 2019-11-19 DIAGNOSIS — F410 Panic disorder [episodic paroxysmal anxiety] without agoraphobia: Secondary | ICD-10-CM

## 2019-11-19 NOTE — BH Specialist Note (Signed)
Integrated Behavioral Health Follow Up Visit via Phone  MRN: [id number] Name: Deloise Brosius  Type of Service: [place] Interpretor:No. Interpretor Name and Language: not applicable.   SUBJECTIVE: Kail Levene is a 54 y.o. female accompanied by herself. Patient was referred by Carlyon Shadow NP for mental health. Patient reports the following symptoms/concerns: She notes that she recently had a set back due to an argument with a family member. She notes that she is not sure if she is where she is meant to be and has considered moving in with another family member. She explains that she has been feeling anxious due to the argument with a family member and whether or not she needs to stay. She explains that she set a healthy boundary with her friend regarding allowing her to figure out how to handle things on her own and has professionals helping her. She denies suicidal and homicidal thoughts.  Duration of problem: ; Severity of problem: mild  OBJECTIVE: Mood: Euthymic and Affect: Appropriate Risk of harm to self or others: No plan to harm self or others  LIFE CONTEXT: Family and Social: see above. School/Work: see above. Self-Care: see above. Life Changes: see above.   GOALS ADDRESSED: Patient will: 1.  Reduce symptoms of: anxiety  2.  Increase knowledge and/or ability of: self-management skills and stress reduction  3.  Demonstrate ability to: Increase healthy adjustment to current life circumstances  INTERVENTIONS: Interventions utilized:  Supportive Counseling was utilized by the clinician focusing on the patient's anxiety and affect on normal cognition. Clinician processed with the patient regarding how she has been doing since the last follow up session. Clinician utilized reflective listening encouraging the patient to ventilate her feelings towards her current situation. Clinician explained to the patient that it sounds like her family  member wanted a different response than she was given but its out of her control. Clinician explained to the patient that its probably best to give her family member some space and allow her to come to her to discuss what she is feeling.  Standardized Assessments completed: GAD-7 and PHQ 9  ASSESSMENT: Patient currently experiencing see above.   Patient may benefit from see above.  PLAN: 1. Follow up with behavioral health clinician on : two weeks or earlier if needed. 2. Behavioral recommendations: see above. 3. Referral(s): [place] (In Clinic) 4. "From scale of 1-10, how likely are you to follow plan?":   Bayard Hugger, LCSW

## 2019-12-03 ENCOUNTER — Ambulatory Visit: Payer: Self-pay | Admitting: Licensed Clinical Social Worker

## 2019-12-03 ENCOUNTER — Other Ambulatory Visit: Payer: Self-pay

## 2019-12-03 DIAGNOSIS — F332 Major depressive disorder, recurrent severe without psychotic features: Secondary | ICD-10-CM

## 2019-12-03 DIAGNOSIS — F410 Panic disorder [episodic paroxysmal anxiety] without agoraphobia: Secondary | ICD-10-CM

## 2019-12-03 NOTE — BH Specialist Note (Signed)
Integrated Behavioral Health Follow Up Visit Via Phone  MRN: [id number] Name: Breanna Hull  Type of Service: [place] Interpretor:No. Interpretor Name and Language: see above.  SUBJECTIVE: Breanna Hull is a 54 y.o. female accompanied by herself. Patient was referred by Carlyon Shadow NP for mental health. Patient reports the following symptoms/concerns: She notes that she spoke with a psychiatrist at [facility] who placed her on Lamotrigine 25 mg for the next two weeks and told her to continue to taking the Sertraline 75 mg at bedtime. She explains that she is more relaxed and calmer in the evenings. She notes that her friend has noticed an improvement in her overall mood and demeanor. She notes that the only change she has noticed that she does not have motivation to do things since she started the new medication. She notes that she is not craving sweets as often as she once was. She notes that she is leaning towards staying with her friend. She notes that she is still looking for a job.She explains that she thinks she is going to move all her mental health treatment to [facility]. She denies suicidal and homicidal thoughts.  Duration of problem: ; Severity of problem: mild  OBJECTIVE: Mood: Euthymic and Affect: Appropriate Risk of harm to self or others: No plan to harm self or others  LIFE CONTEXT: Family and Social: see above. School/Work: see above. Self-Care: see above. Life Changes: see above.   GOALS ADDRESSED: Patient will: 1.  Reduce symptoms of: lack of motivation  2.  Increase knowledge and/or ability of: self-management skills and stress reduction  3.  Demonstrate ability to: Increase healthy adjustment to current life circumstances  INTERVENTIONS: Interventions utilized:  Supportive Counseling was utilized by the clinician during today's follow up session. Clinician processed with the patient regarding how she has been doing since the  last follow up session. Clinician measured the patient's anxiety and depression on a numerical scale. Clinician discussed with the patient regarding how she is doing since the psychiatrist at [facility] changed up her mental health medications. Clinician discussed with the patient regarding if she wants to switch all of her mental health care to [facility] and is she planning to stay with her friend.  Standardized Assessments completed: GAD-7 and PHQ 9  ASSESSMENT: Patient currently experiencing see above.   Patient may benefit from see above.  PLAN: 1. Follow up with behavioral health clinician on :  2. Behavioral recommendations: Clinician explained to the patient that this would be her last follow up session due to transition to [facility] and patient was agreeable to the plans. Clinician referred the patient to a primary care doctor in the area that she is moving to. She explained to the patient that if she moves back that she can contact the clinic to re-establish care as long as she meets the eligibility requirements of [facility]. 3. Referral(s): [place] (In Clinic) 4. "From scale of 1-10, how likely are you to follow plan?":   Bayard Hugger, LCSW

## 2020-02-07 IMAGING — MG MM DIGITAL SCREENING BILAT W/ TOMO W/ CAD
8 series · 8 of 24 positions shown · non-contrast
Comparison: Previous exam(s).

CLINICAL DATA: Screening.

EXAM:
DIGITAL SCREENING BILATERAL MAMMOGRAM WITH TOMO AND CAD

[R CC synth-2D]
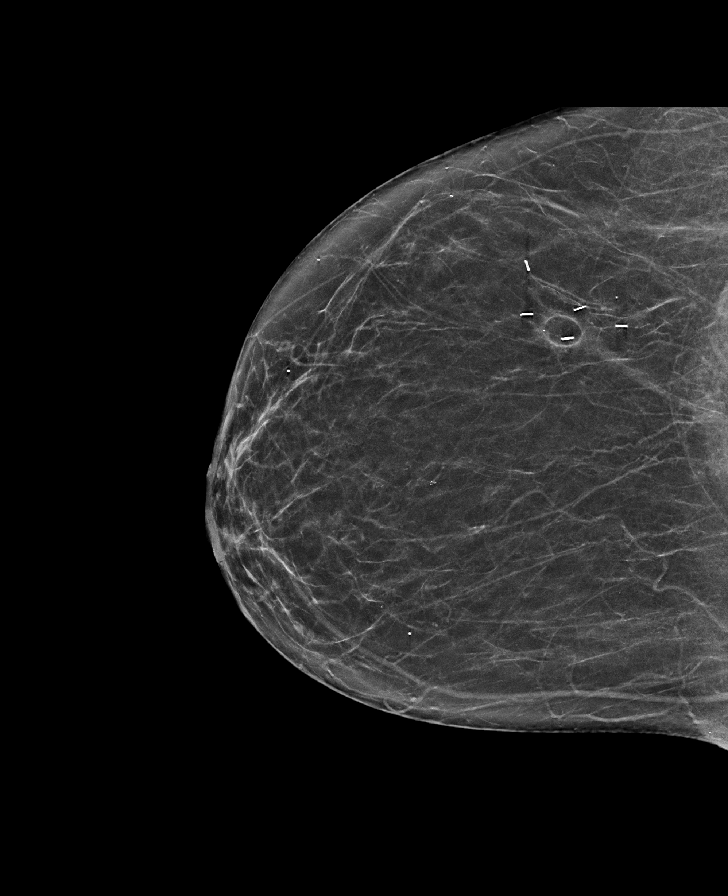

[R MLO synth-2D]
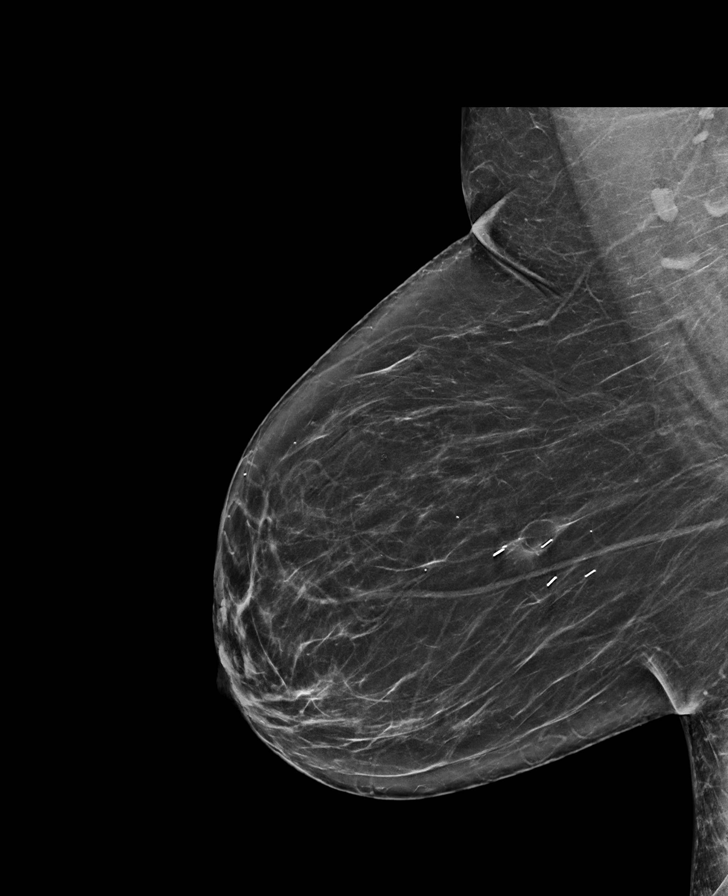

[L MLO synth-2D]
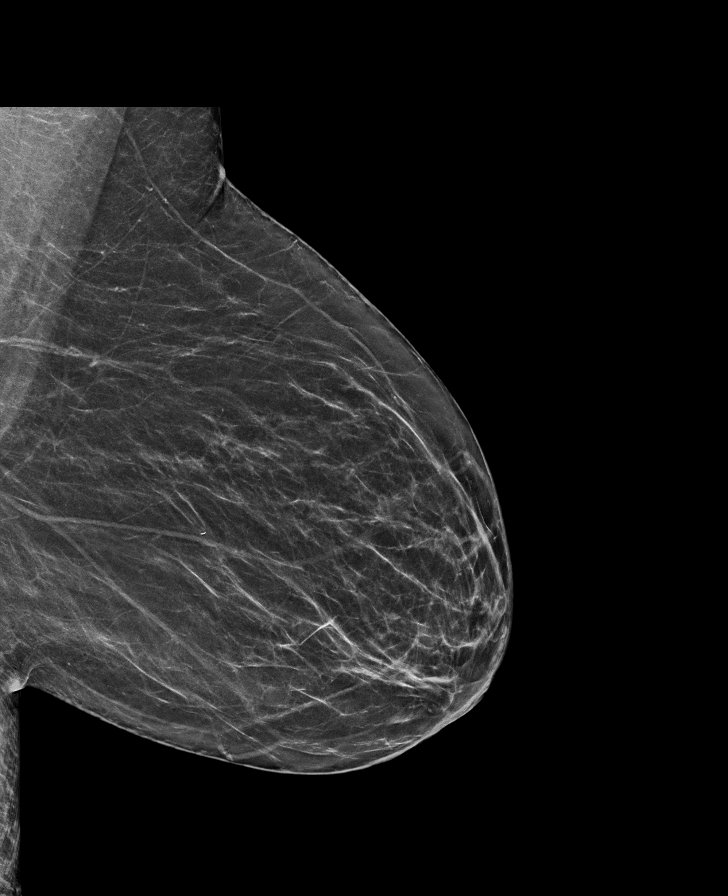

[L CC synth-2D]
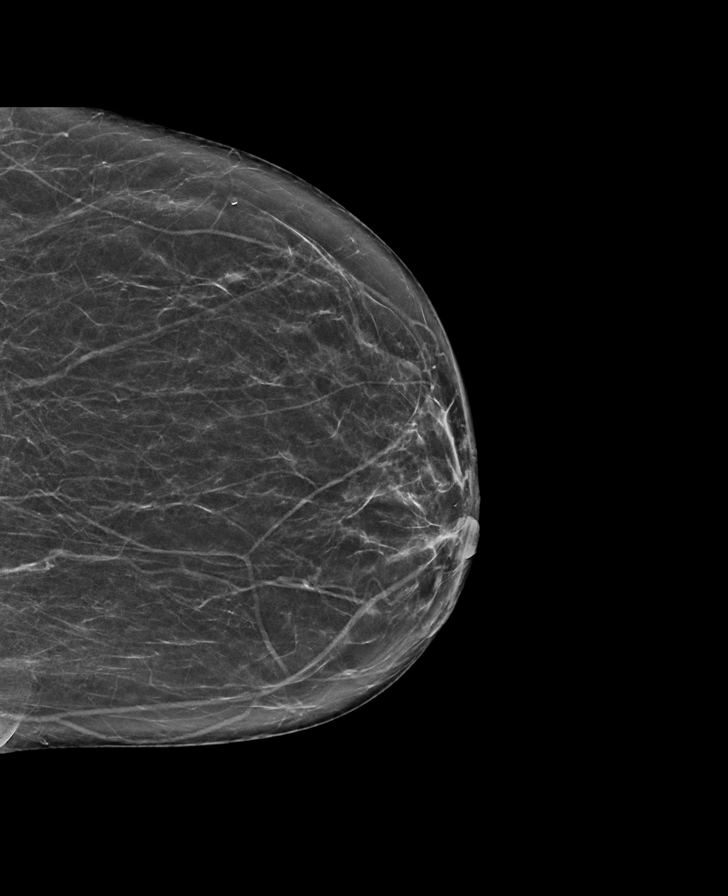

[R MLO tomo · tomo slice 38/75.0]
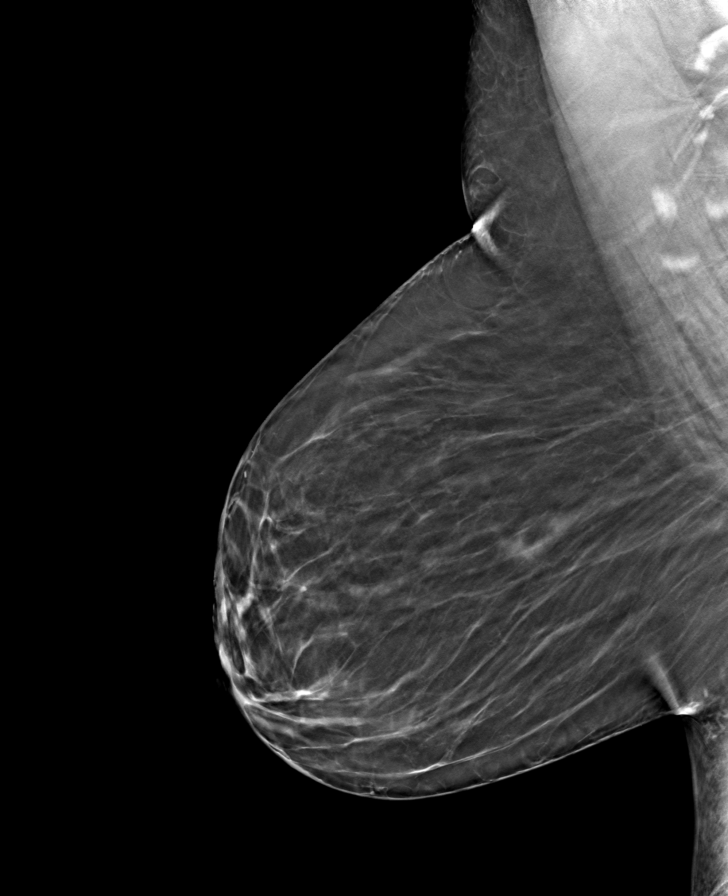

[L CC tomo · tomo slice 35/68.0]
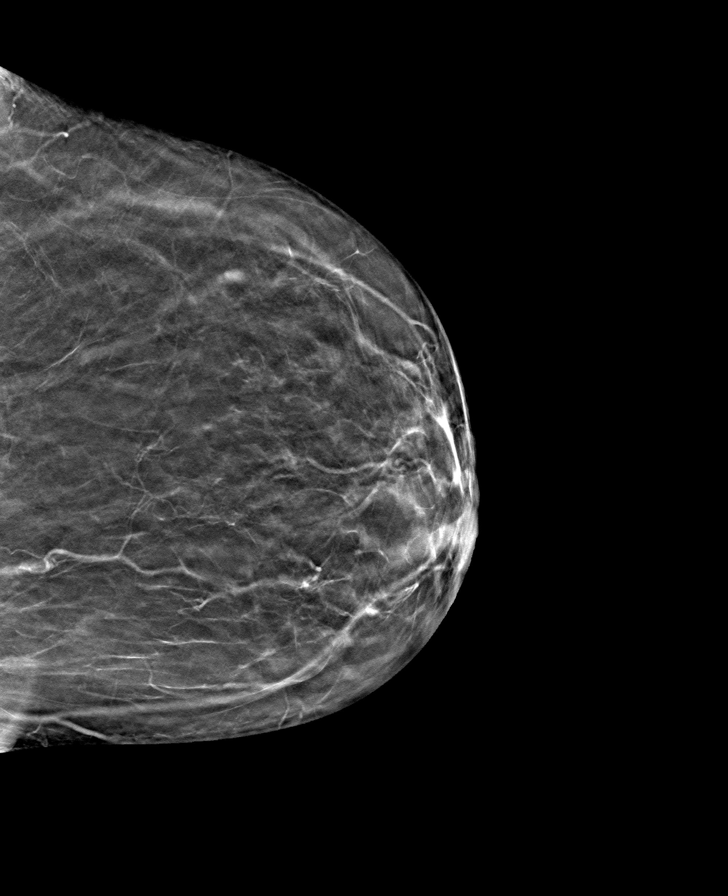

[R CC tomo · tomo slice 32/63.0]
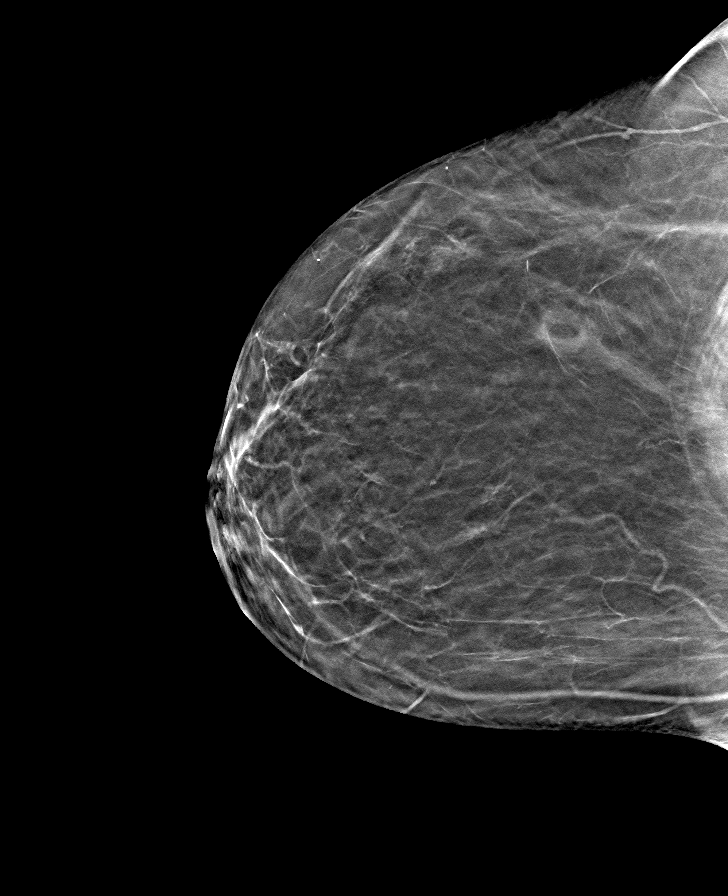

[L MLO tomo · tomo slice 33/64.0]
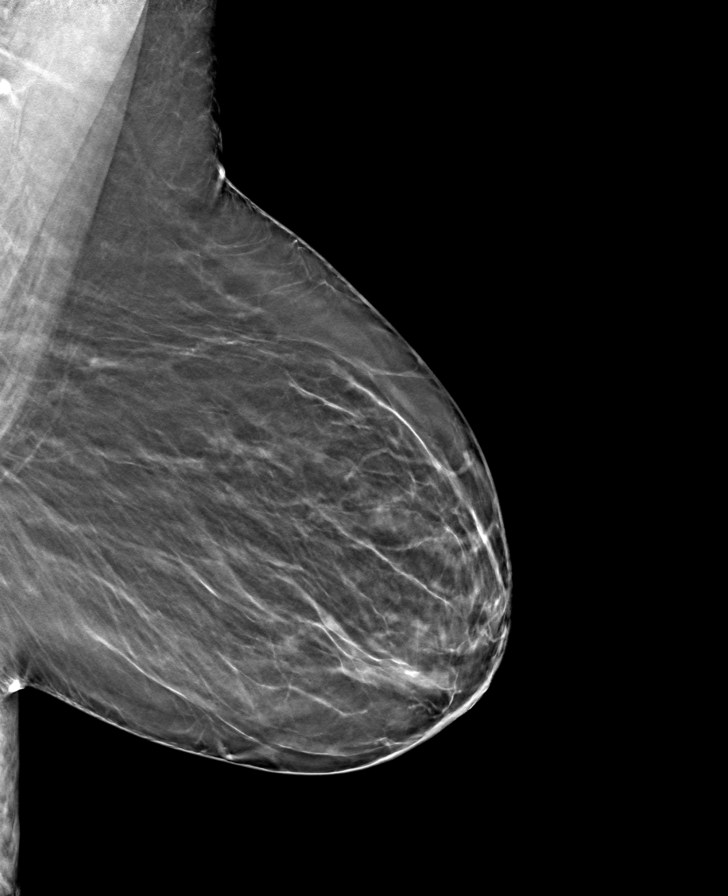

[8 of 24 positions shown; findings below may reference images not displayed]

ACR Breast Density Category b: There are scattered areas of
fibroglandular density.
FINDINGS: There are no findings suspicious for malignancy. Images were
processed with CAD.
IMPRESSION: No mammographic evidence of malignancy. A result letter of this
screening mammogram will be mailed directly to the patient.

RECOMMENDATION:
Screening mammogram in one year. (Code:[id number])

BI-RADS CATEGORY  1: Negative.

## 2020-02-26 ENCOUNTER — Ambulatory Visit: Payer: Self-pay

## 2020-10-06 ENCOUNTER — Telehealth: Payer: Self-pay | Admitting: Pharmacy Technician

## 2020-10-06 NOTE — Telephone Encounter (Signed)
Patient failed to provide 2021 proof of income.  No additional medication assistance will be provided by [facility] without the required proof of income documentation.  Patient notified by letter.  Valeda Corzine J. Jantzen Pilger Care Manager [facility]   [address] [place], [place]  [address]     This is to inform you that you are no longer eligible to receive medication assistance at [facility].  The reason(s) are:    _____Your total gross monthly household income exceeds 250% of the Federal Poverty Level.   _____Tangible assets (savings, checking, stocks/bonds, pension, retirement, etc.) exceeds our limit  _____You are eligible to receive benefits from Medicaid, [facility] or HIV Medication              Assistance Program _____You are eligible to receive benefits from a Medicare Part "D" plan _____You have prescription insurance  _____You are not an [place] resident __X__Failure to provide all requested proof of income information for 2021.    Medication assistance will resume once all requested financial information has been returned to our clinic.  If you have questions, please contact our clinic at [phone].    Thank you,  [facility]
